# Patient Record
Sex: Male | Born: 1960 | Race: Black or African American | Hispanic: No | Marital: Married | Smoking: Former smoker
Health system: Southern US, Community
[De-identification: ages and names within clinical notes are randomized; demographics above are authoritative.]

## PROBLEM LIST (undated history)

## (undated) DIAGNOSIS — T7840XA Allergy, unspecified, initial encounter: Secondary | ICD-10-CM

## (undated) DIAGNOSIS — J302 Other seasonal allergic rhinitis: Secondary | ICD-10-CM

## (undated) DIAGNOSIS — M47812 Spondylosis without myelopathy or radiculopathy, cervical region: Secondary | ICD-10-CM

## (undated) DIAGNOSIS — R030 Elevated blood-pressure reading, without diagnosis of hypertension: Secondary | ICD-10-CM

## (undated) DIAGNOSIS — E7800 Pure hypercholesterolemia, unspecified: Secondary | ICD-10-CM

## (undated) DIAGNOSIS — I890 Lymphedema, not elsewhere classified: Secondary | ICD-10-CM

## (undated) DIAGNOSIS — Z8249 Family history of ischemic heart disease and other diseases of the circulatory system: Secondary | ICD-10-CM

## (undated) HISTORY — DX: Allergy, unspecified, initial encounter: T78.40XA

## (undated) HISTORY — DX: Other seasonal allergic rhinitis: J30.2

## (undated) HISTORY — DX: Family history of ischemic heart disease and other diseases of the circulatory system: Z82.49

## (undated) HISTORY — DX: Pure hypercholesterolemia, unspecified: E78.00

## (undated) HISTORY — DX: Elevated blood-pressure reading, without diagnosis of hypertension: R03.0

## (undated) HISTORY — DX: Lymphedema, not elsewhere classified: I89.0

## (undated) HISTORY — DX: Spondylosis without myelopathy or radiculopathy, cervical region: M47.812

---

## 2011-09-21 ENCOUNTER — Emergency Department (INDEPENDENT_AMBULATORY_CARE_PROVIDER_SITE_OTHER)
Admission: EM | Admit: 2011-09-21 | Discharge: 2011-09-21 | Disposition: A | Discharge status: Home / Self Care | Payer: Self-pay | Source: Home / Self Care

## 2011-09-21 ENCOUNTER — Encounter (HOSPITAL_COMMUNITY): Payer: Self-pay

## 2011-09-21 DIAGNOSIS — S39012A Strain of muscle, fascia and tendon of lower back, initial encounter: Secondary | ICD-10-CM

## 2011-09-21 DIAGNOSIS — S335XXA Sprain of ligaments of lumbar spine, initial encounter: Secondary | ICD-10-CM

## 2011-09-21 MED ORDER — METHOCARBAMOL 750 MG PO TABS: 750.0000 mg | ORAL_TABLET | Freq: Four times a day (QID) | ORAL | Status: AC

## 2011-09-21 MED ORDER — DICLOFENAC SODIUM 75 MG PO TBEC: 75.0000 mg | DELAYED_RELEASE_TABLET | Freq: Two times a day (BID) | ORAL | Status: AC

## 2011-09-21 NOTE — ED Provider Notes (Signed)
Medical screening examination/treatment/procedure(s) were performed by non-physician practitioner and as supervising physician I was immediately available for consultation/collaboration.  Raynald Blend, MD 09/21/11 1330
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
## 2011-09-21 NOTE — Discharge Instructions (Signed)
Back Exercises Back exercises help treat and prevent back injuries. The goal is to increase your strength in your belly (abdominal) and back muscles. These exercises can also help with flexibility. Start these exercises when told by your doctor. HOME CARE Back exercises include: Pelvic Tilt.  Lie on your back with your knees bent. Tilt your pelvis until the lower part of your back is against the floor. Hold this position 5 to 10 sec. Repeat this exercise 5 to 10 times.  Knee to Chest.  Pull 1 knee up against your chest and hold for 20 to 30 seconds. Repeat this with the other knee. This may be done with the other leg straight or bent, whichever feels better. Then, pull both knees up against your chest.  Sit-Ups or Curl-Ups.  Bend your knees 90 degrees. Start with tilting your pelvis, and do a partial, slow sit-up. Only lift your upper half 30 to 45 degrees off the floor. Take at least 2 to 3 seonds for each sit-up. Do not do sit-ups with your knees out straight. If partial sit-ups are difficult, simply do the above but with only tightening your belly (abdominal) muscles and holding it as told.  Hip-Lift.  Lie on your back with your knees flexed 90 degrees. Push down with your feet and shoulders as you raise your hips 2 inches off the floor. Hold for 10 seconds, repeat 5 to 10 times.  Back Arches.  Lie on your stomach. Prop yourself up on bent elbows. Slowly press on your hands, causing an arch in your low back. Repeat 3 to 5 times.  Shoulder-Lifts.  Lie face down with arms beside your body. Keep hips and belly pressed to floor as you slowly lift your head and shoulders off the floor.  Do not overdo your exercises. Be careful in the beginning. Exercises may cause you some mild back discomfort. If the pain lasts for more than 15 minutes, stop the exercises until you see your doctor. Improvement with exercise for back problems is slow.  Document Released: 06/30/2010 Document Revised: 05/17/2011  Document Reviewed: 06/30/2010 ExitCare Patient Information 2012 [employer]. 

## 2013-01-04 ENCOUNTER — Ambulatory Visit: Payer: BC Managed Care – PPO

## 2013-01-04 ENCOUNTER — Ambulatory Visit (INDEPENDENT_AMBULATORY_CARE_PROVIDER_SITE_OTHER): Payer: BC Managed Care – PPO | Admitting: Emergency Medicine

## 2013-01-04 VITALS — BP 118/92 | HR 77 | Temp 97.4°F | Resp 16 | Ht 75.5 in | Wt 232.2 lb

## 2013-01-04 DIAGNOSIS — M25519 Pain in unspecified shoulder: Secondary | ICD-10-CM

## 2013-01-04 DIAGNOSIS — M25511 Pain in right shoulder: Secondary | ICD-10-CM

## 2013-01-04 MED ORDER — MELOXICAM 15 MG PO TABS: 15.0000 mg | ORAL_TABLET | Freq: Every day | ORAL | Status: DC

## 2013-01-04 MED ORDER — HYDROCODONE-ACETAMINOPHEN 5-325 MG PO TABS: ORAL_TABLET | ORAL | Status: DC
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
## 2013-01-04 NOTE — Patient Instructions (Signed)
Bursitis Bursitis is a swelling and soreness (inflammation) of a fluid-filled sac (bursa) that overlies and protects a joint. It can be caused by injury, overuse of the joint, arthritis or infection. The joints most likely to be affected are the elbows, shoulders, hips and knees. HOME CARE INSTRUCTIONS   Apply ice to the affected area for 15-20 minutes each hour while awake for 2 days. Put the ice in a plastic bag and place a towel between the bag of ice and your skin.  Rest the injured joint as much as possible, but continue to put the joint through a full range of motion, 4 times per day. (The shoulder joint especially becomes rapidly "frozen" if not used.) When the pain lessens, begin normal slow movements and usual activities.  Only take over-the-counter or prescription medicines for pain, discomfort or fever as directed by your caregiver.  Your caregiver may recommend draining the bursa and injecting medicine into the bursa. This may help the healing process.  Follow all instructions for follow-up with your caregiver. This includes any orthopedic referrals, physical therapy and rehabilitation. Any delay in obtaining necessary care could result in a delay or failure of the bursitis to heal and chronic pain. SEEK IMMEDIATE MEDICAL CARE IF:   Your pain increases even during treatment.  You develop an oral temperature above 102 F (38.9 C) and have heat and inflammation over the involved bursa. MAKE SURE YOU:   Understand these instructions.  Will watch your condition.  Will get help right away if you are not doing well or get worse. Document Released: 05/25/2000 Document Revised: 08/20/2011 Document Reviewed: 04/29/2009 ExitCare Patient Information 2014 [employer].  

## 2019-03-13 ENCOUNTER — Ambulatory Visit (INDEPENDENT_AMBULATORY_CARE_PROVIDER_SITE_OTHER): Payer: Self-pay | Admitting: Interventional Cardiology

## 2019-03-13 ENCOUNTER — Encounter: Payer: Self-pay | Admitting: Interventional Cardiology

## 2019-03-13 ENCOUNTER — Other Ambulatory Visit: Payer: Self-pay

## 2019-03-13 VITALS — BP 132/101 | HR 65 | Ht 75.5 in | Wt 236.0 lb

## 2019-03-13 DIAGNOSIS — I10 Essential (primary) hypertension: Secondary | ICD-10-CM

## 2019-03-13 DIAGNOSIS — E785 Hyperlipidemia, unspecified: Secondary | ICD-10-CM

## 2019-03-13 MED ORDER — LOSARTAN POTASSIUM-HCTZ 50-12.5 MG PO TABS: 1.0000 | ORAL_TABLET | Freq: Every day | ORAL | 3 refills | Status: DC

## 2019-03-13 MED ORDER — ATORVASTATIN CALCIUM 20 MG PO TABS: 20.0000 mg | ORAL_TABLET | Freq: Every day | ORAL | 3 refills | Status: DC

## 2019-03-13 NOTE — Progress Notes (Signed)
Cardiology Office Note:    Date:  03/13/2019   ID:  Billy Figueroa, DOB [birth year], MRN [id number]  PCP:  Seward Carol, MD  Cardiologist:  No primary care provider on file.   Referring MD: Seward Carol, MD   Chief Complaint  Patient presents with   Hypertension   Hyperlipidemia   Advice Only    Family history CAD (father age 56)    History of Present Illness:    Billy Figueroa is a 58 y.o. male with a hx of hyperlipidemia and recent significant elevation in blood pressure requiring initiation of diuretic therapy.  Dr. Seward Carol is the primary care physician.  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  He is concerned about his blood pressure.  Despite therapy being started greater than a week ago he is still running diastolic blood pressures near and slightly above 100 mmHg.  He is tolerated hydrochlorothiazide 25 mg/day without difficulty.  He has no specific cardiac complaints with the exception of perhaps some mild dyspnea when walking up hills.  He has purposefully lost weight.  He has been mindful of salt.  He does not drink regularly.  He is not on nonsteroidal anti-inflammatory therapy.  He is concerned that his father (who had hypertension and smokes cigarettes) had a acute myocardial infarction in his late 10s and died of an MI at age 42.  His father was a Company secretary.  He has no siblings.  His mother now in her 58s has hypertension.  Past Medical History:  Diagnosis Date   Allergy    Arthritis of neck    Chronic acquired lymphedema    bilateral lower extremities   Elevated blood pressure reading    Family history of heart disease    High cholesterol    Seasonal allergies     History reviewed. No pertinent surgical history.  Current Medications: Current Meds  Medication Sig   HYDROcodone-acetaminophen (NORCO) 5-325 MG per tablet Take one tablet at night as needed for pain   levocetirizine (XYZAL) 5 MG  tablet Take 5 mg by mouth every evening.   [DISCONTINUED] atorvastatin (LIPITOR) 10 MG tablet Take 10 mg by mouth daily.   [DISCONTINUED] hydrochlorothiazide (HYDRODIURIL) 25 MG tablet Take 25 mg by mouth daily.     Allergies:   Other   Social History   Socioeconomic History   Marital status: Married    Spouse name: Not on file   Number of children: Not on file   Years of education: Not on file   Highest education level: Not on file  Occupational History   Occupation: university admin    Employer: [employer] resource strain: Not on file   Food insecurity    Worry: Not on file    Inability: Not on file   Transportation needs    Medical: Not on file    Non-medical: Not on file  Tobacco Use   Smoking status: Former Smoker   Smokeless tobacco: Never Used  Substance and Sexual Activity   Alcohol use: Yes    Comment: beer for wine occasionally   Drug use: No   Sexual activity: Not on file    Comment: married  Lifestyle   Physical activity    Days per week: Not on file    Minutes per session: Not on file   Stress: Not on file  Relationships   Social connections    Talks on phone:  Not on file    Gets together: Not on file    Attends religious service: Not on file    Active member of club or organization: Not on file    Attends meetings of clubs or organizations: Not on file    Relationship status: Not on file  Other Topics Concern   Not on file  Social History Narrative   Not on file     Family History: The patient's family history includes Arthritis in his maternal grandmother; Asthma in his maternal grandfather and mother; Heart disease in his father; Neuropathy in his mother; Stroke in his paternal grandmother.  ROS:   Please see the history of present illness.    Stress at his job at [employer].  He is responsible for his mother who is becoming more frail and requires attention.  All other  systems reviewed and are negative.  EKGs/Labs/Other Studies Reviewed:    The following studies were reviewed today: No cardiac evaluation has been performed. Labs performed 03/03/2019:  LDL cholesterol 104; total cholesterol 168  Creatinine 1.22.  BUN 16.    EKG:  EKG EKG reveals normal sinus rhythm, RSR prime V1.  Basically, overall EKG is normal in appearance.  When compared to a tracing performed [facility] in [facility], no changes noted.  Recent Labs: No results found for requested labs within last 8760 hours.  Recent Lipid Panel No results found for: CHOL, TRIG, HDL, CHOLHDL, VLDL, LDLCALC, LDLDIRECT  Physical Exam:    VS:  BP (!) 132/101    Pulse 65    Ht 6' 3.5" (1.918 m)    Wt 236 lb (107 kg)    SpO2 96%    BMI 29.11 kg/m     Wt Readings from Last 3 Encounters:  03/13/19 236 lb (107 kg)  01/04/13 232 lb 3.2 oz (105.3 kg)     GEN: Tall, nonobese. No acute distress HEENT: Normal NECK: No JVD. LYMPHATICS: No lymphadenopathy CARDIAC:  RRR without murmur, but there is an S4 gallop,.  No edema. VASCULAR:  Normal Pulses. No bruits. RESPIRATORY:  Clear to auscultation without rales, wheezing or rhonchi  ABDOMEN: Soft, non-tender, non-distended, No pulsatile mass, MUSCULOSKELETAL: No deformity  SKIN: Warm and dry NEUROLOGIC:  Alert and oriented x 3 PSYCHIATRIC:  Normal affect   ASSESSMENT:    1. Hypertension, unspecified type   2. Hyperlipidemia, unspecified hyperlipidemia type    PLAN:    In order of problems listed above:  1. Hypertension, with target 130/80 mmHg.  Will continue therapy for hypertension by discontinuing HCTZ and starting losartan HCTZ 50/12.5 mg.  We will consider increasing to 100/12.5 mg if blood pressure is not better controlled with 2 agents.  We discussed the possibility of needing even a third agent added.  He will have a basic metabolic panel done in 7 to 14 days.  He will require blood pressure home and we will make further  adjustments as needed based upon response. 2. Hyperlipidemia with risk factors that include sex, age, hypertension, and family history.  Further uptitrate Lipitor to 25 mg/day.  Overall education and awareness concerning primary risk prevention was discussed in detail: LDL less than 70, hemoglobin A1c less than 7, blood pressure target less than 130/80 mmHg, >150 minutes of moderate aerobic activity per week, avoidance of smoking, weight control (via diet and exercise), and continued surveillance/management of/for obstructive sleep apnea.    Medication Adjustments/Labs and Tests Ordered: Current medicines are reviewed at length with the patient  today.  Concerns regarding medicines are outlined above.  Orders Placed This Encounter  Procedures   Lipid panel   Basic metabolic panel   Hepatic function panel   EKG 12-Lead   Meds ordered this encounter  Medications   DISCONTD: losartan-hydrochlorothiazide (HYZAAR) 50-12.5 MG tablet    Sig: Take 1 tablet by mouth daily.    Dispense:  90 tablet    Refill:  3    D/c plain HCTZ   DISCONTD: atorvastatin (LIPITOR) 20 MG tablet    Sig: Take 1 tablet (20 mg total) by mouth daily.    Dispense:  90 tablet    Refill:  3    Dose change   atorvastatin (LIPITOR) 20 MG tablet    Sig: Take 1 tablet (20 mg total) by mouth daily.    Dispense:  90 tablet    Refill:  3    Dose change   losartan-hydrochlorothiazide (HYZAAR) 50-12.5 MG tablet    Sig: Take 1 tablet by mouth daily.    Dispense:  90 tablet    Refill:  3    D/c plain HCTZ    Patient Instructions  Medication Instructions:  1) INCREASE Atorvastatin to 20mg  once daily 2) DISCONTINUE Hydrochlorothiazide 3) START Losartan/HCTZ 50/12.5mg  once daily  If you need a refill on your cardiac medications before your next appointment, please call your pharmacy.   Lab work: Your physician recommends that you return for lab work in: 7-10 days (BMET)  Your physician recommends that you  return for lab work in: 6 weeks.  You will need to be fasting for these labs (nothing to eat or drink after midnight except water and black coffee).   If you have labs (blood work) drawn today and your tests are completely normal, you will receive your results only by:  [place] (if you have MyChart) OR  A paper copy in the mail If you have any lab test that is abnormal or we need to change your treatment, we will call you to review the results.  Testing/Procedures: None  Follow-Up: At [facility], you and your health needs are our priority.  As part of our continuing mission to provide you with exceptional heart care, we have created designated Provider Care Teams.  These Care Teams include your primary Cardiologist (physician) and Advanced Practice Providers (APPs -  Physician Assistants and Nurse Practitioners) who all work together to provide you with the care you need, when you need it. You will need a follow up appointment in 6 months.  Please call our office 2 months in advance to schedule this appointment.  You may see Dr. Tamala Julian or one of the following Advanced Practice Providers on your designated Care Team:   Truitt Merle, NP Cecilie Kicks, NP  Kathyrn Drown, NP  Any Other Special Instructions Will Be Listed Below (If Applicable).  Monitor your blood pressure daily for a week and contact the office with those readings.  Make sure you take it at least 2 hours after your blood pressure medication.       Signed, Sinclair Grooms, MD  03/13/2019 12:37 PM    [place]

## 2019-03-13 NOTE — Patient Instructions (Signed)
Medication Instructions:  1) INCREASE Atorvastatin to 20mg  once daily 2) DISCONTINUE Hydrochlorothiazide 3) START Losartan/HCTZ 50/12.5mg  once daily  If you need a refill on your cardiac medications before your next appointment, please call your pharmacy.   Lab work: Your physician recommends that you return for lab work in: 7-10 days (BMET)  Your physician recommends that you return for lab work in: 6 weeks.  You will need to be fasting for these labs (nothing to eat or drink after midnight except water and black coffee).   If you have labs (blood work) drawn today and your tests are completely normal, you will receive your results only by: [facility] Kitchen MyChart Message (if you have MyChart) OR . A paper copy in the mail If you have any lab test that is abnormal or we need to change your treatment, we will call you to review the results.  Testing/Procedures: None  Follow-Up: At [facility], you and your health needs are our priority.  As part of our continuing mission to provide you with exceptional heart care, we have created designated Provider Care Teams.  These Care Teams include your primary Cardiologist (physician) and Advanced Practice Providers (APPs -  Physician Assistants and Nurse Practitioners) who all work together to provide you with the care you need, when you need it. You will need a follow up appointment in 6 months.  Please call our office 2 months in advance to schedule this appointment.  You may see Dr. Tamala Julian or one of the following Advanced Practice Providers on your designated Care Team:   Truitt Merle, NP Cecilie Kicks, NP . Kathyrn Drown, NP  Any Other Special Instructions Will Be Listed Below (If Applicable).  Monitor your blood pressure daily for a week and contact the office with those readings.  Make sure you take it at least 2 hours after your blood pressure medication.

## 2019-03-23 ENCOUNTER — Other Ambulatory Visit: Payer: BC Managed Care – PPO | Admitting: *Deleted

## 2019-03-23 ENCOUNTER — Other Ambulatory Visit: Payer: Self-pay

## 2019-03-23 DIAGNOSIS — I10 Essential (primary) hypertension: Secondary | ICD-10-CM

## 2019-03-23 LAB — BASIC METABOLIC PANEL
BUN/Creatinine Ratio: 14 (ref 9–20)
BUN: 17 mg/dL (ref 6–24)
CO2: 26 mmol/L (ref 20–29)
Calcium: 9.4 mg/dL (ref 8.7–10.2)
Chloride: 102 mmol/L (ref 96–106)
Creatinine, Ser: 1.22 mg/dL (ref 0.76–1.27)
GFR calc Af Amer: 76 mL/min/{1.73_m2} (ref >59)
GFR calc non Af Amer: 66 mL/min/{1.73_m2} (ref >59)
Glucose: 104 mg/dL — ABNORMAL HIGH (ref 65–99)
Potassium: 4 mmol/L (ref 3.5–5.2)
Sodium: 141 mmol/L (ref 134–144)

## 2019-04-27 ENCOUNTER — Other Ambulatory Visit: Payer: Self-pay

## 2019-04-27 ENCOUNTER — Other Ambulatory Visit: Payer: BC Managed Care – PPO | Admitting: *Deleted

## 2019-04-27 DIAGNOSIS — E785 Hyperlipidemia, unspecified: Secondary | ICD-10-CM

## 2019-04-27 LAB — HEPATIC FUNCTION PANEL
ALT: 21 IU/L (ref 0–44)
AST: 15 IU/L (ref 0–40)
Albumin: 4.3 g/dL (ref 3.8–4.9)
Alkaline Phosphatase: 74 IU/L (ref 39–117)
Bilirubin Total: 0.7 mg/dL (ref 0.0–1.2)
Bilirubin, Direct: 0.16 mg/dL (ref 0.00–0.40)
Total Protein: 6.9 g/dL (ref 6.0–8.5)

## 2019-04-27 LAB — LIPID PANEL
Chol/HDL Ratio: 4 ratio (ref 0.0–5.0)
Cholesterol, Total: 198 mg/dL (ref 100–199)
HDL: 50 mg/dL (ref >39)
LDL Chol Calc (NIH): 127 mg/dL — ABNORMAL HIGH (ref 0–99)
Triglycerides: 117 mg/dL (ref 0–149)
VLDL Cholesterol Cal: 21 mg/dL (ref 5–40)

## 2019-04-30 ENCOUNTER — Ambulatory Visit: Payer: BC Managed Care – PPO | Admitting: Nurse Practitioner

## 2019-09-09 ENCOUNTER — Ambulatory Visit: Payer: BC Managed Care – PPO | Admitting: Interventional Cardiology

## 2019-09-22 NOTE — Progress Notes (Deleted)
Cardiology Office Note:    Date:  09/22/2019   ID:  Billy Figueroa, DOB 1960/12/26, MRN [id number]  PCP:  Seward Carol, MD  Cardiologist:  Sinclair Grooms, MD   Referring MD: Seward Carol, MD   No chief complaint on file.   History of Present Illness:    Billy Figueroa is a 59 y.o. male with a hx of hypertension and hyperlipidemia.  ***  Past Medical History:  Diagnosis Date  . Allergy   . Arthritis of neck   . Chronic acquired lymphedema    bilateral lower extremities  . Elevated blood pressure reading   . Family history of heart disease   . High cholesterol   . Seasonal allergies     No past surgical history on file.  Current Medications: No outpatient medications have been marked as taking for the 09/23/19 encounter (Appointment) with Belva Crome, MD.     Allergies:   Other   Social History   Socioeconomic History  . Marital status: Married    Spouse name: Not on file  . Number of children: Not on file  . Years of education: Not on file  . Highest education level: Not on file  Occupational History  . Occupation: Nurse, adult: [employer]  Tobacco Use  . Smoking status: Former Research scientist (life sciences)  . Smokeless tobacco: Never Used  Substance and Sexual Activity  . Alcohol use: Yes    Comment: beer for wine occasionally  . Drug use: No  . Sexual activity: Not on file    Comment: married  Other Topics Concern  . Not on file  Social History Narrative  . Not on file   Social Determinants of Health   Financial Resource Strain:   . Difficulty of Paying Living Expenses:   Food Insecurity:   . Worried About Charity fundraiser in the Last Year:   . Arboriculturist in the Last Year:   Transportation Needs:   . Film/video editor (Medical):   Marland Kitchen Lack of Transportation (Non-Medical):   Physical Activity:   . Days of Exercise per Week:   . Minutes of Exercise per Session:   Stress:   . Feeling of Stress :   Social  Connections:   . Frequency of Communication with Friends and Family:   . Frequency of Social Gatherings with Friends and Family:   . Attends Religious Services:   . Active Member of Clubs or Organizations:   . Attends Archivist Meetings:   Marland Kitchen Marital Status:      Family History: The patient's family history includes Arthritis in his maternal grandmother; Asthma in his maternal grandfather and mother; Heart disease in his father; Neuropathy in his mother; Stroke in his paternal grandmother.  ROS:   Please see the history of present illness.    *** All other systems reviewed and are negative.  EKGs/Labs/Other Studies Reviewed:    The following studies were reviewed today: ***  EKG:  EKG ***  Recent Labs: 03/23/2019: BUN 17; Creatinine, Ser 1.22; Potassium 4.0; Sodium 141 04/27/2019: ALT 21  Recent Lipid Panel    Component Value Date/Time   CHOL 198 04/27/2019 0804   TRIG 117 04/27/2019 0804   HDL 50 04/27/2019 0804   CHOLHDL 4.0 04/27/2019 0804   LDLCALC 127 (H) 04/27/2019 0804    Physical Exam:    VS:  There were no vitals taken for this visit.    Wt  Readings from Last 3 Encounters:  03/13/19 236 lb (107 kg)  01/04/13 232 lb 3.2 oz (105.3 kg)     GEN: ***. No acute distress HEENT: Normal NECK: No JVD. LYMPHATICS: No lymphadenopathy CARDIAC: *** RRR without murmur, gallop, or edema. VASCULAR: *** Normal Pulses. No bruits. RESPIRATORY:  Clear to auscultation without rales, wheezing or rhonchi  ABDOMEN: Soft, non-tender, non-distended, No pulsatile mass, MUSCULOSKELETAL: No deformity  SKIN: Warm and dry NEUROLOGIC:  Alert and oriented x 3 PSYCHIATRIC:  Normal affect   ASSESSMENT:    1. Hypertension, unspecified type    PLAN:    In order of problems listed above:  1. ***   Medication Adjustments/Labs and Tests Ordered: Current medicines are reviewed at length with the patient today.  Concerns regarding medicines are outlined above.  No  orders of the defined types were placed in this encounter.  No orders of the defined types were placed in this encounter.   There are no Patient Instructions on file for this visit.   Signed, Sinclair Grooms, MD  09/22/2019 6:22 PM    [facility]

## 2019-09-23 ENCOUNTER — Ambulatory Visit: Payer: BC Managed Care – PPO | Admitting: Interventional Cardiology

## 2019-09-30 NOTE — Progress Notes (Signed)
Cardiology Office Note   Date:  10/01/2019   ID:  Billy Figueroa, DOB 10/17/1960, MRN [id number]  PCP:  Seward Carol, MD  Cardiologist: Dr. Tamala Julian, MD   Chief Complaint  Patient presents with  . Follow-up    History of Present Illness: Billy Figueroa is a 59 y.o. male who presents for 78-month follow-up, seen for Dr. Tamala Julian.  Billy Figueroa has a history of hyperlipidemia and hypertension as well as a family history of CAD.  He was initially referred to Dr. Tamala Julian from his PCP for significant elevation in his blood pressure requiring initiation of diuretic therapy.  Despite being on therapy for greater than than 1 week his diastolic blood pressures continued to be above 100 mmHg.  Was recently seen by Dr. Tamala Julian 03/13/2019 at which time HCTZ was discontinued with the initiation of losartan/HCTZ at 50/12.5 mg with plans for potentially increasing this to 100/12.5 if BP does not respond to therapies.  Today Billy Figueroa is doing well from a CV standpoint. BP is very well controlled at 120/88 without taking his medications this morning. He has no c/o chest pain, palpitations, LE edema, dizziness or syncope. He has been busy taking care of his 33yo mother. Discussed obtaining lab work today.    Past Medical History:  Diagnosis Date  . Allergy   . Arthritis of neck   . Chronic acquired lymphedema    bilateral lower extremities  . Elevated blood pressure reading   . Family history of heart disease   . High cholesterol   . Seasonal allergies     History reviewed. No pertinent surgical history.   Current Outpatient Medications  Medication Sig Dispense Refill  . atorvastatin (LIPITOR) 20 MG tablet Take 1 tablet (20 mg total) by mouth daily. 90 tablet 3  . levocetirizine (XYZAL) 5 MG tablet Take 5 mg by mouth every evening.    Marland Kitchen losartan-hydrochlorothiazide (HYZAAR) 50-12.5 MG tablet Take 1 tablet by mouth daily. 90 tablet 3   No current facility-administered medications for  this visit.    Allergies:   Other    Social History:  The patient  reports that he has quit smoking. He has never used smokeless tobacco. He reports current alcohol use. He reports that he does not use drugs.   Family History:  The patient's family history includes Arthritis in his maternal grandmother; Asthma in his maternal grandfather and mother; Heart disease in his father; Neuropathy in his mother; Stroke in his paternal grandmother.    ROS:  Please see the history of present illness. Otherwise, review of systems are positive for none.  All other systems are reviewed and negative.    PHYSICAL EXAM: VS:  BP 120/88 (BP Location: Right Arm, Patient Position: Sitting, Cuff Size: Normal)   Pulse 69   Ht 6' 3.5" (1.918 m)   Wt 249 lb 12.8 oz (113.3 kg)   SpO2 97%   BMI 30.81 kg/m  , BMI Body mass index is 30.81 kg/m.   General: Well developed, well nourished, NAD Neck: Negative for carotid bruits. No JVD Lungs:Clear to ausculation bilaterally. No wheezes, rales, or rhonchi. Breathing is unlabored. Cardiovascular: RRR with S1 S2. No murmurs Extremities: No edema. Radial pulses 2+ bilaterally Neuro: Alert and oriented. No focal deficits. No facial asymmetry. MAE spontaneously. Psych: Responds to questions appropriately with normal affect.     EKG:  EKG is not ordered today.  Recent Labs: 03/23/2019: BUN 17; Creatinine, Ser 1.22; Potassium 4.0; Sodium 141 04/27/2019: ALT  21    Lipid Panel    Component Value Date/Time   CHOL 198 04/27/2019 0804   TRIG 117 04/27/2019 0804   HDL 50 04/27/2019 0804   CHOLHDL 4.0 04/27/2019 0804   LDLCALC 127 (H) 04/27/2019 0804    Wt Readings from Last 3 Encounters:  10/01/19 249 lb 12.8 oz (113.3 kg)  03/13/19 236 lb (107 kg)  01/04/13 232 lb 3.2 oz (105.3 kg)    ASSESSMENT AND PLAN:  1.  Hypertension: -Stable, 120/88 -Continue losartan/HCTZ 50/12.5 -Check BMET today   2.  Hyperlipidemia: -Last LDL, 127 on 04/27/2019 -Continue  current dose of atorvastatin -Will obtain CMET and lipid panel    Current medicines are reviewed at length with the patient today.  The patient does not have concerns regarding medicines.  The following changes have been made:  no change  Labs/ tests ordered today include: CMET, lipid  No orders of the defined types were placed in this encounter.   Disposition:   FU with Dr. Tamala Julian  in 1 year  Signed, Kathyrn Drown, NP  10/01/2019 8:51 AM    [address], [place], [place]  [address] Phone: [phone]; Fax: [phone]

## 2019-10-01 ENCOUNTER — Ambulatory Visit: Payer: BC Managed Care – PPO | Admitting: Cardiology

## 2019-10-01 ENCOUNTER — Other Ambulatory Visit: Payer: Self-pay

## 2019-10-01 ENCOUNTER — Encounter: Payer: Self-pay | Admitting: Cardiology

## 2019-10-01 VITALS — BP 120/88 | HR 69 | Ht 75.5 in | Wt 249.8 lb

## 2019-10-01 DIAGNOSIS — I10 Essential (primary) hypertension: Secondary | ICD-10-CM | POA: Diagnosis not present

## 2019-10-01 DIAGNOSIS — E785 Hyperlipidemia, unspecified: Secondary | ICD-10-CM | POA: Diagnosis not present

## 2019-10-01 LAB — COMPREHENSIVE METABOLIC PANEL
ALT: 18 IU/L (ref 0–44)
AST: 16 IU/L (ref 0–40)
Albumin/Globulin Ratio: 1.4 (ref 1.2–2.2)
Albumin: 4 g/dL (ref 3.8–4.9)
Alkaline Phosphatase: 61 IU/L (ref 39–117)
BUN/Creatinine Ratio: 10 (ref 9–20)
BUN: 13 mg/dL (ref 6–24)
Bilirubin Total: 0.5 mg/dL (ref 0.0–1.2)
CO2: 27 mmol/L (ref 20–29)
Calcium: 9.5 mg/dL (ref 8.7–10.2)
Chloride: 102 mmol/L (ref 96–106)
Creatinine, Ser: 1.26 mg/dL (ref 0.76–1.27)
GFR calc Af Amer: 73 mL/min/{1.73_m2} (ref >59)
GFR calc non Af Amer: 63 mL/min/{1.73_m2} (ref >59)
Globulin, Total: 2.9 g/dL (ref 1.5–4.5)
Glucose: 107 mg/dL — ABNORMAL HIGH (ref 65–99)
Potassium: 4.1 mmol/L (ref 3.5–5.2)
Sodium: 140 mmol/L (ref 134–144)
Total Protein: 6.9 g/dL (ref 6.0–8.5)

## 2019-10-01 LAB — LIPID PANEL
Chol/HDL Ratio: 3.4 ratio (ref 0.0–5.0)
Cholesterol, Total: 152 mg/dL (ref 100–199)
HDL: 45 mg/dL (ref >39)
LDL Chol Calc (NIH): 87 mg/dL (ref 0–99)
Triglycerides: 109 mg/dL (ref 0–149)
VLDL Cholesterol Cal: 20 mg/dL (ref 5–40)

## 2019-10-01 NOTE — Patient Instructions (Signed)
Medication Instructions:   Your physician recommends that you continue on your current medications as directed. Please refer to the Current Medication list given to you today.  *If you need a refill on your cardiac medications before your next appointment, please call your pharmacy*  Lab Work:  You will have labs drawn today: CMET/Lipid panel  If you have labs (blood work) drawn today and your tests are completely normal, you will receive your results only by: Marland Kitchen MyChart Message (if you have MyChart) OR . A paper copy in the mail If you have any lab test that is abnormal or we need to change your treatment, we will call you to review the results.  Testing/Procedures:  None ordered today  Follow-Up: At [facility], you and your health needs are our priority.  As part of our continuing mission to provide you with exceptional heart care, we have created designated Provider Care Teams.  These Care Teams include your primary Cardiologist (physician) and Advanced Practice Providers (APPs -  Physician Assistants and Nurse Practitioners) who all work together to provide you with the care you need, when you need it.  We recommend signing up for the patient portal called "MyChart".  Sign up information is provided on this After Visit Summary.  MyChart is used to connect with patients for Virtual Visits (Telemedicine).  Patients are able to view lab/test results, encounter notes, upcoming appointments, etc.  Non-urgent messages can be sent to your provider as well.   To learn more about what you can do with MyChart, go to [email or web address].    Your next appointment:   12 month(s)  The format for your next appointment:   In Person  Provider:   Daneen Schick, MD

## 2019-10-01 NOTE — Addendum Note (Signed)
Addended by: Mady Haagensen on: 10/01/2019 08:53 AM   Modules accepted: Orders

## 2020-03-04 ENCOUNTER — Telehealth: Payer: Self-pay | Admitting: Interventional Cardiology

## 2020-03-04 ENCOUNTER — Other Ambulatory Visit: Payer: Self-pay | Admitting: *Deleted

## 2020-03-04 NOTE — Telephone Encounter (Signed)
New message:      Patient calling concerning his medication might need to be look at. Please call patient.

## 2020-03-04 NOTE — Telephone Encounter (Signed)
Pt states he has noticed his BP has been elevated the last few days.  Last night BP was 164/90.  This morning 176/90.  Denies any symptoms.  Denies increased salt intake.  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  Recently started on Sertraline.  Was on 25mg  x 1 week and moved up to 50mg  two days ago.  States he hasn't been exercising as much but has not slowed down as far as being up and moving around all day.  Recently purchased a Engineer, civil (consulting) and is working on increasing his exercise now.  Advised pt we don't typically change meds when BP is up a day or two.  Advised to monitor BP QD, at least 2 hours after medication, for 5-7 days and call or MyChart me those numbers.  Pt verbalized understanding and was in agreement with plan.

## 2020-03-07 ENCOUNTER — Other Ambulatory Visit: Payer: Self-pay | Admitting: Interventional Cardiology

## 2020-09-17 ENCOUNTER — Encounter (INDEPENDENT_AMBULATORY_CARE_PROVIDER_SITE_OTHER): Payer: Self-pay

## 2021-02-20 ENCOUNTER — Other Ambulatory Visit: Payer: Self-pay | Admitting: Interventional Cardiology

## 2021-03-28 ENCOUNTER — Other Ambulatory Visit: Payer: Self-pay | Admitting: Interventional Cardiology

## 2021-03-29 ENCOUNTER — Other Ambulatory Visit: Payer: Self-pay | Admitting: Interventional Cardiology

## 2021-04-06 ENCOUNTER — Other Ambulatory Visit: Payer: Self-pay | Admitting: Interventional Cardiology

## 2021-04-18 ENCOUNTER — Ambulatory Visit
Admission: RE | Admit: 2021-04-18 | Discharge: 2021-04-18 | Disposition: A | Discharge status: Home / Self Care | Payer: BC Managed Care – PPO | Source: Ambulatory Visit | Attending: Internal Medicine | Admitting: Internal Medicine

## 2021-04-18 ENCOUNTER — Other Ambulatory Visit: Payer: Self-pay

## 2021-04-18 ENCOUNTER — Other Ambulatory Visit: Payer: Self-pay | Admitting: Internal Medicine

## 2021-04-18 DIAGNOSIS — R822 Biliuria: Secondary | ICD-10-CM

## 2021-04-18 DIAGNOSIS — R7989 Other specified abnormal findings of blood chemistry: Secondary | ICD-10-CM

## 2021-04-18 DIAGNOSIS — R82998 Other abnormal findings in urine: Secondary | ICD-10-CM

## 2021-04-19 ENCOUNTER — Other Ambulatory Visit: Payer: Self-pay | Admitting: Interventional Cardiology

## 2021-04-19 ENCOUNTER — Other Ambulatory Visit: Payer: BC Managed Care – PPO

## 2021-04-20 ENCOUNTER — Other Ambulatory Visit: Payer: BC Managed Care – PPO

## 2021-04-20 ENCOUNTER — Other Ambulatory Visit: Payer: Self-pay | Admitting: Internal Medicine

## 2021-04-20 DIAGNOSIS — R17 Unspecified jaundice: Secondary | ICD-10-CM

## 2021-04-20 DIAGNOSIS — R7989 Other specified abnormal findings of blood chemistry: Secondary | ICD-10-CM

## 2021-04-21 ENCOUNTER — Other Ambulatory Visit: Payer: Self-pay | Admitting: Interventional Cardiology

## 2021-04-22 ENCOUNTER — Emergency Department (HOSPITAL_COMMUNITY): Payer: BC Managed Care – PPO

## 2021-04-22 ENCOUNTER — Encounter (HOSPITAL_COMMUNITY): Payer: Self-pay | Admitting: Emergency Medicine

## 2021-04-22 ENCOUNTER — Observation Stay (HOSPITAL_COMMUNITY): Payer: BC Managed Care – PPO

## 2021-04-22 ENCOUNTER — Other Ambulatory Visit: Payer: Self-pay

## 2021-04-22 ENCOUNTER — Inpatient Hospital Stay (HOSPITAL_COMMUNITY)
Admission: EM | Admit: 2021-04-22 | Discharge: 2021-04-24 | DRG: 436 | Disposition: A | Discharge status: Home / Self Care | Payer: BC Managed Care – PPO | Attending: Internal Medicine | Admitting: Internal Medicine

## 2021-04-22 DIAGNOSIS — Z8249 Family history of ischemic heart disease and other diseases of the circulatory system: Secondary | ICD-10-CM

## 2021-04-22 DIAGNOSIS — C221 Intrahepatic bile duct carcinoma: Principal | ICD-10-CM | POA: Diagnosis present

## 2021-04-22 DIAGNOSIS — K625 Hemorrhage of anus and rectum: Secondary | ICD-10-CM | POA: Diagnosis present

## 2021-04-22 DIAGNOSIS — Z825 Family history of asthma and other chronic lower respiratory diseases: Secondary | ICD-10-CM

## 2021-04-22 DIAGNOSIS — Z823 Family history of stroke: Secondary | ICD-10-CM

## 2021-04-22 DIAGNOSIS — I10 Essential (primary) hypertension: Secondary | ICD-10-CM | POA: Diagnosis present

## 2021-04-22 DIAGNOSIS — K7200 Acute and subacute hepatic failure without coma: Secondary | ICD-10-CM

## 2021-04-22 DIAGNOSIS — N179 Acute kidney failure, unspecified: Secondary | ICD-10-CM | POA: Diagnosis present

## 2021-04-22 DIAGNOSIS — Z79899 Other long term (current) drug therapy: Secondary | ICD-10-CM

## 2021-04-22 DIAGNOSIS — R7401 Elevation of levels of liver transaminase levels: Secondary | ICD-10-CM | POA: Diagnosis present

## 2021-04-22 DIAGNOSIS — Z8261 Family history of arthritis: Secondary | ICD-10-CM

## 2021-04-22 DIAGNOSIS — R17 Unspecified jaundice: Secondary | ICD-10-CM | POA: Diagnosis present

## 2021-04-22 DIAGNOSIS — Z20822 Contact with and (suspected) exposure to covid-19: Secondary | ICD-10-CM | POA: Diagnosis present

## 2021-04-22 DIAGNOSIS — F419 Anxiety disorder, unspecified: Secondary | ICD-10-CM

## 2021-04-22 DIAGNOSIS — E782 Mixed hyperlipidemia: Secondary | ICD-10-CM | POA: Diagnosis present

## 2021-04-22 DIAGNOSIS — K649 Unspecified hemorrhoids: Secondary | ICD-10-CM | POA: Diagnosis present

## 2021-04-22 DIAGNOSIS — K8021 Calculus of gallbladder without cholecystitis with obstruction: Secondary | ICD-10-CM | POA: Diagnosis present

## 2021-04-22 DIAGNOSIS — Z87891 Personal history of nicotine dependence: Secondary | ICD-10-CM

## 2021-04-22 DIAGNOSIS — N289 Disorder of kidney and ureter, unspecified: Secondary | ICD-10-CM | POA: Diagnosis present

## 2021-04-22 LAB — I-STAT VENOUS BLOOD GAS, ED
Acid-Base Excess: 5 mmol/L — ABNORMAL HIGH (ref 0.0–2.0)
Bicarbonate: 30.3 mmol/L — ABNORMAL HIGH (ref 20.0–28.0)
Calcium, Ion: 1.08 mmol/L — ABNORMAL LOW (ref 1.15–1.40)
HCT: 45 % (ref 39.0–52.0)
Hemoglobin: 15.3 g/dL (ref 13.0–17.0)
O2 Saturation: 49 %
Potassium: 4.6 mmol/L (ref 3.5–5.1)
Sodium: 136 mmol/L (ref 135–145)
TCO2: 32 mmol/L (ref 22–32)
pCO2, Ven: 46.5 mmHg (ref 44.0–60.0)
pH, Ven: 7.422 (ref 7.250–7.430)
pO2, Ven: 26 mmHg — CL (ref 32.0–45.0)

## 2021-04-22 LAB — URINALYSIS, ROUTINE W REFLEX MICROSCOPIC
Glucose, UA: NEGATIVE mg/dL
Ketones, ur: NEGATIVE mg/dL
Leukocytes,Ua: NEGATIVE
Nitrite: NEGATIVE
Protein, ur: 30 mg/dL — AB
Specific Gravity, Urine: 1.01 (ref 1.005–1.030)
pH: 5 (ref 5.0–8.0)

## 2021-04-22 LAB — CBC WITH DIFFERENTIAL/PLATELET
Abs Immature Granulocytes: 0.05 10*3/uL (ref 0.00–0.07)
Basophils Absolute: 0 10*3/uL (ref 0.0–0.1)
Basophils Relative: 0 %
Eosinophils Absolute: 0.3 10*3/uL (ref 0.0–0.5)
Eosinophils Relative: 3 %
HCT: 42.7 % (ref 39.0–52.0)
Hemoglobin: 14.6 g/dL (ref 13.0–17.0)
Immature Granulocytes: 1 %
Lymphocytes Relative: 10 %
Lymphs Abs: 1 10*3/uL (ref 0.7–4.0)
MCH: 31.1 pg (ref 26.0–34.0)
MCHC: 34.2 g/dL (ref 30.0–36.0)
MCV: 91 fL (ref 80.0–100.0)
Monocytes Absolute: 0.7 10*3/uL (ref 0.1–1.0)
Monocytes Relative: 7 %
Neutro Abs: 8.6 10*3/uL — ABNORMAL HIGH (ref 1.7–7.7)
Neutrophils Relative %: 79 %
Platelets: 199 10*3/uL (ref 150–400)
RBC: 4.69 MIL/uL (ref 4.22–5.81)
RDW: 13.8 % (ref 11.5–15.5)
WBC: 10.7 10*3/uL — ABNORMAL HIGH (ref 4.0–10.5)
nRBC: 0 % (ref 0.0–0.2)

## 2021-04-22 LAB — COMPREHENSIVE METABOLIC PANEL
ALT: 732 U/L — ABNORMAL HIGH (ref 0–44)
AST: 394 U/L — ABNORMAL HIGH (ref 15–41)
Albumin: 3.3 g/dL — ABNORMAL LOW (ref 3.5–5.0)
Alkaline Phosphatase: 491 U/L — ABNORMAL HIGH (ref 38–126)
Anion gap: 11 (ref 5–15)
BUN: 17 mg/dL (ref 6–20)
CO2: 22 mmol/L (ref 22–32)
Calcium: 9.6 mg/dL (ref 8.9–10.3)
Chloride: 101 mmol/L (ref 98–111)
Creatinine, Ser: 2.06 mg/dL — ABNORMAL HIGH (ref 0.61–1.24)
GFR, Estimated: 37 mL/min — ABNORMAL LOW (ref >60)
Glucose, Bld: 135 mg/dL — ABNORMAL HIGH (ref 70–99)
Potassium: 3.9 mmol/L (ref 3.5–5.1)
Sodium: 134 mmol/L — ABNORMAL LOW (ref 135–145)
Total Bilirubin: 17.5 mg/dL — ABNORMAL HIGH (ref 0.3–1.2)
Total Protein: 7.9 g/dL (ref 6.5–8.1)

## 2021-04-22 LAB — FERRITIN: Ferritin: 1444 ng/mL — ABNORMAL HIGH (ref 24–336)

## 2021-04-22 LAB — LIPASE, BLOOD: Lipase: 35 U/L (ref 11–51)

## 2021-04-22 LAB — BILIRUBIN, DIRECT: Bilirubin, Direct: 9.9 mg/dL — ABNORMAL HIGH (ref 0.0–0.2)

## 2021-04-22 LAB — RESP PANEL BY RT-PCR (FLU A&B, COVID) ARPGX2
Influenza A by PCR: NEGATIVE
Influenza B by PCR: NEGATIVE
SARS Coronavirus 2 by RT PCR: NEGATIVE

## 2021-04-22 LAB — HIV ANTIBODY (ROUTINE TESTING W REFLEX): HIV Screen 4th Generation wRfx: NONREACTIVE

## 2021-04-22 LAB — ACETAMINOPHEN LEVEL: Acetaminophen (Tylenol), Serum: <10 ug/mL — ABNORMAL LOW (ref 10–30)

## 2021-04-22 LAB — SALICYLATE LEVEL: Salicylate Lvl: 8.3 mg/dL (ref 7.0–30.0)

## 2021-04-22 LAB — PROTIME-INR
INR: 1.2 (ref 0.8–1.2)
Prothrombin Time: 14.7 seconds (ref 11.4–15.2)

## 2021-04-22 LAB — OSMOLALITY: Osmolality: 291 mOsm/kg (ref 275–295)

## 2021-04-22 LAB — AMMONIA: Ammonia: 61 umol/L — ABNORMAL HIGH (ref 9–35)

## 2021-04-22 LAB — TSH: TSH: 1.251 u[IU]/mL (ref 0.350–4.500)

## 2021-04-22 MED ORDER — LACTATED RINGERS IV SOLN: INTRAVENOUS | Status: AC

## 2021-04-22 MED ORDER — ONDANSETRON HCL 4 MG/2ML IJ SOLN: 4.0000 mg | Freq: Four times a day (QID) | INTRAMUSCULAR | Status: DC | PRN

## 2021-04-22 MED ORDER — SODIUM CHLORIDE 0.9 % IV BOLUS
1000.0000 mL | Freq: Once | INTRAVENOUS | Status: AC
  Administered 2021-04-22: 1000 mL via INTRAVENOUS

## 2021-04-22 MED ORDER — LOSARTAN POTASSIUM-HCTZ 50-12.5 MG PO TABS: 1.0000 | ORAL_TABLET | Freq: Every day | ORAL | Status: DC

## 2021-04-22 MED ORDER — ONDANSETRON HCL 4 MG/2ML IJ SOLN
4.0000 mg | Freq: Once | INTRAMUSCULAR | Status: DC
  Filled 2021-04-22: qty 2

## 2021-04-22 MED ORDER — ONDANSETRON HCL 4 MG PO TABS: 4.0000 mg | ORAL_TABLET | Freq: Two times a day (BID) | ORAL | Status: DC | PRN

## 2021-04-22 MED ORDER — LACTATED RINGERS IV SOLN: INTRAVENOUS | Status: DC

## 2021-04-22 MED ORDER — ONDANSETRON 4 MG PO TBDP: 4.0000 mg | ORAL_TABLET | Freq: Once | ORAL | Status: DC

## 2021-04-22 MED ORDER — ONDANSETRON HCL 4 MG PO TABS: 4.0000 mg | ORAL_TABLET | Freq: Four times a day (QID) | ORAL | Status: DC | PRN

## 2021-04-22 MED ORDER — ONDANSETRON HCL 4 MG/2ML IJ SOLN: 4.0000 mg | Freq: Two times a day (BID) | INTRAMUSCULAR | Status: DC | PRN

## 2021-04-22 NOTE — ED Provider Notes (Signed)
[facility] EMERGENCY DEPARTMENT Provider Note   CSN: [id number] Arrival date & time: 04/22/21  1400     History Chief Complaint  Patient presents with   Jaundice    Billy Figueroa is a 60 y.o. male here with jaundice and vomiting.  Patient has been jaundiced for about a week or so.  Several days ago, patient went to see PCP and had AST and ALT in the 500s and bilirubin of 11.  He had a unremarkable right upper quadrant ultrasound.  Patient was scheduled for outpatient CT abdomen pelvis.  He states that today he felt nauseated and had an episode of diaphoresis. Denies any pain right now. Denies any fever.  He was noted to be hypotensive in triage  The history is provided by the patient.      Past Medical History:  Diagnosis Date   Allergy    Arthritis of neck    Chronic acquired lymphedema    bilateral lower extremities   Elevated blood pressure reading    Family history of heart disease    High cholesterol    Seasonal allergies     There are no problems to display for this patient.   History reviewed. No pertinent surgical history.     Family History  Problem Relation Age of Onset   Asthma Mother    Neuropathy Mother        legs   Heart disease Father    Arthritis Maternal Grandmother    Asthma Maternal Grandfather    Stroke Paternal Grandmother     Social History   Tobacco Use   Smoking status: Former   Smokeless tobacco: Never  Scientific laboratory technician Use: Never used  Substance Use Topics   Alcohol use: Yes    Comment: beer for wine occasionally   Drug use: No    Home Medications Prior to Admission medications   Medication Sig Start Date End Date Taking? Authorizing Provider  desonide (DESOWEN) 0.05 % ointment Apply 1-2 application topically 2 (two) times daily as needed (rash). 03/14/21  Yes [provider]  levocetirizine (XYZAL) 5 MG tablet Take 5 mg by mouth daily as needed for allergies.   Yes [provider]   losartan-hydrochlorothiazide (HYZAAR) 50-12.5 MG tablet Take 1 tablet by mouth daily. Pt needs to schedule appt with provider for additional refills - 3rd attempt 04/06/21  Yes Belva Crome, MD  PARoxetine (PAXIL) 20 MG tablet Take 20 mg by mouth daily. 04/05/21  Yes [provider]  atorvastatin (LIPITOR) 20 MG tablet TAKE 1 TABLET BY MOUTH EVERY DAY 03/28/21   Belva Crome, MD    Allergies    Other  Review of Systems   Review of Systems  Gastrointestinal:  Positive for vomiting.  All other systems reviewed and are negative.  Physical Exam Updated Vital Signs BP 112/84   Pulse 65   Temp 97.8 F (36.6 C) (Oral)   Resp 16   SpO2 99%   Physical Exam Vitals and nursing note reviewed.  HENT:     Head: Normocephalic.     Nose: Nose normal.     Mouth/Throat:     Mouth: Mucous membranes are moist.  Eyes:     Extraocular Movements: Extraocular movements intact.     Pupils: Pupils are equal, round, and reactive to light.     Comments: Jaundice  Cardiovascular:     Rate and Rhythm: Normal rate and regular rhythm.     Pulses: Normal  pulses.  Pulmonary:     Effort: Pulmonary effort is normal.     Breath sounds: Normal breath sounds.  Abdominal:     General: Abdomen is flat.     Palpations: Abdomen is soft.     Comments: Mild epigastric tenderness  Musculoskeletal:        General: Normal range of motion.     Cervical back: Normal range of motion and neck supple.  Skin:    General: Skin is warm.     Capillary Refill: Capillary refill takes less than 2 seconds.  Neurological:     General: No focal deficit present.     Mental Status: He is alert and oriented to person, place, and time.  Psychiatric:        Mood and Affect: Mood normal.        Behavior: Behavior normal.    ED Results / Procedures / Treatments   Labs (all labs ordered are listed, but only abnormal results are displayed) Labs Reviewed  COMPREHENSIVE METABOLIC PANEL - Abnormal; Notable for the  following components:      Result Value   Sodium 134 (*)    Glucose, Bld 135 (*)    Creatinine, Ser 2.06 (*)    Albumin 3.3 (*)    AST 394 (*)    ALT 732 (*)    Alkaline Phosphatase 491 (*)    Total Bilirubin 17.5 (*)    GFR, Estimated 37 (*)    All other components within normal limits  CBC WITH DIFFERENTIAL/PLATELET - Abnormal; Notable for the following components:   WBC 10.7 (*)    Neutro Abs 8.6 (*)    All other components within normal limits  BILIRUBIN, DIRECT - Abnormal; Notable for the following components:   Bilirubin, Direct 9.9 (*)    All other components within normal limits  RESP PANEL BY RT-PCR (FLU A&B, COVID) ARPGX2  LIPASE, BLOOD  OSMOLALITY  URINALYSIS, ROUTINE W REFLEX MICROSCOPIC  AMMONIA  BLOOD GAS, VENOUS  ACETAMINOPHEN LEVEL  SALICYLATE LEVEL    EKG None  Radiology CT ABDOMEN PELVIS WO CONTRAST  Result Date: 04/22/2021 CLINICAL DATA:  Abdominal distension and vomiting. EXAM: CT ABDOMEN AND PELVIS WITHOUT CONTRAST TECHNIQUE: Multidetector CT imaging of the abdomen and pelvis was performed following the standard protocol without IV contrast. COMPARISON:  None. FINDINGS: Lower chest: There is minimal atelectasis in both lung bases. Hepatobiliary: Gallstones are present. There is no bile duct dilatation. The liver is within normal limits. Pancreas: Unremarkable. No pancreatic ductal dilatation or surrounding inflammatory changes. Spleen: Normal in size without focal abnormality. Adrenals/Urinary Tract: There is a rounded 12 mm hypodense area in the lower pole the left kidney measuring 33 Hounsfield units. There is a second mildly hyperdense area in the lower pole the left kidney measuring 6 mm. There is no evidence for urinary tract calculus or hydronephrosis in either kidney. Kidneys are normal in size. Adrenal glands are within normal limits. Bladder is decompressed, but otherwise within normal limits. Stomach/Bowel: Stomach is within normal limits. Appendix  appears normal. No evidence of bowel wall thickening, distention, or acute inflammatory changes. There is submucosal fat deposition in the transverse colon. Vascular/Lymphatic: Aortic atherosclerosis. No enlarged abdominal or pelvic lymph nodes. Reproductive: Prostate is unremarkable. Other: There are small fat containing inguinal and umbilical hernias. There is no ascites or free air. Musculoskeletal: No acute or significant osseous findings. IMPRESSION: 1. No acute localizing process in the abdomen or pelvis. 2. Cholelithiasis. 3. There are 2 mildly hyperdense areas  in the left kidney which are too small to characterize. Recommend further evaluation with ultrasound or MRI to exclude small solid lesion. 4. Submucosal fat deposition in the transverse colon can be seen with chronic inflammation. No acute inflammatory changes. Electronically Signed   By: Ronney Asters M.D.   On: 04/22/2021 17:44   CT HEAD WO CONTRAST (5MM)  Result Date: 04/22/2021 CLINICAL DATA:  Delirium EXAM: CT HEAD WITHOUT CONTRAST TECHNIQUE: Contiguous axial images were obtained from the base of the skull through the vertex without intravenous contrast. COMPARISON:  None. FINDINGS: Brain: No evidence of acute intracranial hemorrhage or extra-axial collection.No evidence of mass lesion/concern mass effect.The ventricles are normal in size.There is bifrontal mild loss of gray-white matter differentiation (series 4, image 17). Vascular: No hyperdense vessel or unexpected calcification. Skull: Normal. Negative for fracture or focal lesion. Sinuses/Orbits: No acute finding. Other: None. IMPRESSION: Possible focal loss of gray-white matter differentiation along the bifrontal lobes. Recommend MRI for further evaluation. No acute intracranial hemorrhage. These results were called by telephone at the time of interpretation on 04/22/2021 at 5:56 pm to ED provider, who verbally acknowledged these results. Electronically Signed   By: Maurine Simmering M.D.    On: 04/22/2021 17:56    Procedures Procedures   Angiocath insertion Performed by: Wandra Arthurs  Consent: Verbal consent obtained. Risks and benefits: risks, benefits and alternatives were discussed Time out: Immediately prior to procedure a "time out" was called to verify the correct patient, procedure, equipment, support staff and site/side marked as required.  Preparation: Patient was prepped and draped in the usual sterile fashion.  Vein Location: R forearm   Ultrasound Guided  Gauge: 20 long   Normal blood return and flush without difficulty Patient tolerance: Patient tolerated the procedure well with no immediate complications.   CRITICAL CARE Performed by: Wandra Arthurs   Total critical care time:30 minutes  Critical care time was exclusive of separately billable procedures and treating other patients.  Critical care was necessary to treat or prevent imminent or life-threatening deterioration.  Critical care was time spent personally by me on the following activities: development of treatment plan with patient and/or surrogate as well as nursing, discussions with consultants, evaluation of patient's response to treatment, examination of patient, obtaining history from patient or surrogate, ordering and performing treatments and interventions, ordering and review of laboratory studies, ordering and review of radiographic studies, pulse oximetry and re-evaluation of patient's condition.   Medications Ordered in ED Medications  ondansetron (ZOFRAN) injection 4 mg (0 mg Intravenous Hold 04/22/21 1739)  sodium chloride 0.9 % bolus 1,000 mL (1,000 mLs Intravenous New Bag/Given 04/22/21 1738)    ED Course  I have reviewed the triage vital signs and the nursing notes.  Pertinent labs & imaging results that were available during my care of the patient were reviewed by me and considered in my medical decision making (see chart for details).    MDM Rules/Calculators/A&P                            Billy Figueroa is a 60 y.o. male here presenting with epigastric pain and jaundice.  Patient has painless jaundice.  We will get CT abdomen pelvis to rule mass  6:45 PM CT showed no mass.  His bilirubin is increased to 17 now.  His direct bili is 9.9.  His AST and ALT and alk phos is also more elevated.  He also has acute renal  failure with creatinine of 2 now. His CT didn't show any obvious mass.  His CT head that showed some bilateral loss of gray matter differentiation.  I talked to Dr. Watt Climes from [facility].  He recommend MRCP tonight.  He states that he will see the patient tomorrow and that patient can eat right now.  Will likely need MRI brain as well.  At this point, hospitalist to admit for acute renal failure, liver failure  Final Clinical Impression(s) / ED Diagnoses Final diagnoses:  None    Rx / DC Orders ED Discharge Orders     None        Drenda Freeze, MD 04/22/21 1846

## 2021-04-22 NOTE — ED Provider Notes (Signed)
Emergency Medicine Provider Triage Evaluation Note  Billy Figueroa , a 60 y.o. male  was evaluated in triage.  Pt complains of acutely worsening jaundice. Patient had new onset darkening urine, light stools, skin color changes 2 weeks ago with heartburn type pain. US liver performed with no acute blockage, patient was scheduled for CT. Patient now with nausea, vomiting, fever, chills, fatigue, and acutely worsening jaundice. Told by GI to come to ED. No hx heavy alcohol abuse, gallstones.  Review of Systems  Positive: As above Negative: As above  Physical Exam  BP (!) 80/61 (BP Location: Right Arm)   Pulse 91   Temp 97.8 F (36.6 C) (Oral)   Resp 18   SpO2 100%  Gen:   Awake, no distress, diaphoretic Resp:  Normal effort  MSK:   Moves extremities without difficulty  Other:  Significant jaundice and scleral icterus, minimal TTP ruq  Medical Decision Making  Medically screening exam initiated at 2:21 PM.  Appropriate orders placed.  Billy Figueroa was informed that the remainder of the evaluation will be completed by another provider, this initial triage assessment does not replace that evaluation, and the importance of remaining in the ED until their evaluation is complete.  Worsening jaundice, abdominal pain   Anselmo Pickler, PA-C 04/22/21 1423    Godfrey Pick, MD 04/23/21 [phone]

## 2021-04-22 NOTE — ED Notes (Signed)
ED TO INPATIENT HANDOFF REPORT  ED Nurse Name and Phone #:   S Name/Age/Gender Billy Figueroa 60 y.o. male Room/Bed: 026C/026C  Code Status   Code Status: Full Code  Home/SNF/Other Home  Is this baseline? Yes   Triage Complete: Triage complete  Chief Complaint Transaminitis [R74.01]  Triage Note Pt reports liver enzymes have been elevated recently and he hasn't had a CT.  Reports nausea and an episode of vomiting yesterday.  Jaundice since Monday.  Denies pain.  Denies nausea/vomiting today.  Pt diaphoretic.   Allergies Allergies  Allergen Reactions   Other Anaphylaxis and Other (See Comments)    Apples raw throat swelling    Level of Care/Admitting Diagnosis ED Disposition     ED Disposition  Admit   Condition  --   Comment  Hospital Area: [place] [[id number]]  Level of Care: Telemetry Medical [104]  May place patient in observation at [facility] or [place] if equivalent level of care is available:: Yes  Covid Evaluation: Confirmed COVID Negative  Diagnosis: Transaminitis [[id number]]  Admitting Physician: Orma Flaming [[id number]]  Attending Physician: Orma Flaming [[id number]]          B Medical/Surgery History Past Medical History:  Diagnosis Date   Allergy    Arthritis of neck    Chronic acquired lymphedema    bilateral lower extremities   Elevated blood pressure reading    Family history of heart disease    High cholesterol    Seasonal allergies    History reviewed. No pertinent surgical history.   A IV Location/Drains/Wounds Patient Lines/Drains/Airways Status     Active Line/Drains/Airways     Name Placement date Placement time Site Days   Peripheral IV 04/22/21 20 G Right Antecubital 04/22/21  1736  Antecubital  less than 1            Intake/Output Last 24 hours No intake or output data in the 24 hours ending 04/22/21 2023  Labs/Imaging Results for orders placed or performed during the hospital encounter  of 04/22/21 (from the past 48 hour(s))  Lipase, blood     Status: None   Collection Time: 04/22/21  2:29 PM  Result Value Ref Range   Lipase 35 11 - 51 U/L    Comment: Performed at [facility], [place] [address] [place], [place] [address]  Comprehensive metabolic panel     Status: Abnormal   Collection Time: 04/22/21  2:29 PM  Result Value Ref Range   Sodium 134 (L) 135 - 145 mmol/L   Potassium 3.9 3.5 - 5.1 mmol/L   Chloride 101 98 - 111 mmol/L   CO2 22 22 - 32 mmol/L   Glucose, Bld 135 (H) 70 - 99 mg/dL    Comment: Glucose reference range applies only to samples taken after fasting for at least 8 hours.   BUN 17 6 - 20 mg/dL   Creatinine, Ser 2.06 (H) 0.61 - 1.24 mg/dL   Calcium 9.6 8.9 - 10.3 mg/dL   Total Protein 7.9 6.5 - 8.1 g/dL   Albumin 3.3 (L) 3.5 - 5.0 g/dL   AST 394 (H) 15 - 41 U/L   ALT 732 (H) 0 - 44 U/L   Alkaline Phosphatase 491 (H) 38 - 126 U/L   Total Bilirubin 17.5 (H) 0.3 - 1.2 mg/dL   GFR, Estimated 37 (L) >60 mL/min    Comment: (NOTE) Calculated using the CKD-EPI Creatinine Equation (2021)    Anion gap 11 5 - 15  Comment: Performed at [facility], [place] [address], [place], [place] [address]  CBC with Differential     Status: Abnormal   Collection Time: 04/22/21  2:40 PM  Result Value Ref Range   WBC 10.7 (H) 4.0 - 10.5 K/uL   RBC 4.69 4.22 - 5.81 MIL/uL   Hemoglobin 14.6 13.0 - 17.0 g/dL   HCT 42.7 39.0 - 52.0 %   MCV 91.0 80.0 - 100.0 fL   MCH 31.1 26.0 - 34.0 pg   MCHC 34.2 30.0 - 36.0 g/dL   RDW 13.8 11.5 - 15.5 %   Platelets 199 150 - 400 K/uL   nRBC 0.0 0.0 - 0.2 %   Neutrophils Relative % 79 %   Neutro Abs 8.6 (H) 1.7 - 7.7 K/uL   Lymphocytes Relative 10 %   Lymphs Abs 1.0 0.7 - 4.0 K/uL   Monocytes Relative 7 %   Monocytes Absolute 0.7 0.1 - 1.0 K/uL   Eosinophils Relative 3 %   Eosinophils Absolute 0.3 0.0 - 0.5 K/uL   Basophils Relative 0 %   Basophils Absolute 0.0 0.0 - 0.1 K/uL   Immature Granulocytes 1 %   Abs  Immature Granulocytes 0.05 0.00 - 0.07 K/uL    Comment: Performed at [facility] [address], [place], [place] [address]  Resp Panel by RT-PCR (Flu A&B, Covid) Nasopharyngeal Swab     Status: None   Collection Time: 04/22/21  4:20 PM   Specimen: Nasopharyngeal Swab; Nasopharyngeal(NP) swabs in vial transport medium  Result Value Ref Range   SARS Coronavirus 2 by RT PCR NEGATIVE NEGATIVE    Comment: (NOTE) SARS-CoV-2 target nucleic acids are NOT DETECTED.  The SARS-CoV-2 RNA is generally detectable in upper respiratory specimens during the acute phase of infection. The lowest concentration of SARS-CoV-2 viral copies this assay can detect is 138 copies/mL. A negative result does not preclude SARS-Cov-2 infection and should not be used as the sole basis for treatment or other patient management decisions. A negative result may occur with  improper specimen collection/handling, submission of specimen other than nasopharyngeal swab, presence of viral mutation(s) within the areas targeted by this assay, and inadequate number of viral copies(<138 copies/mL). A negative result must be combined with clinical observations, patient history, and epidemiological information. The expected result is Negative.  Fact Sheet for Patients:  EntrepreneurPulse.com.au  Fact Sheet for Healthcare Providers:  IncredibleEmployment.be  This test is no t yet approved or cleared by the [place] FDA and  has been authorized for detection and/or diagnosis of SARS-CoV-2 by FDA under an Emergency Use Authorization (EUA). This EUA will remain  in effect (meaning this test can be used) for the duration of the COVID-19 declaration under Section 564(b)(1) of the Act, 21 U.S.C.section 360bbb-3(b)(1), unless the authorization is terminated  or revoked sooner.       Influenza A by PCR NEGATIVE NEGATIVE   Influenza B by PCR NEGATIVE NEGATIVE    Comment: (NOTE) The  Xpert Xpress SARS-CoV-2/FLU/RSV plus assay is intended as an aid in the diagnosis of influenza from Nasopharyngeal swab specimens and should not be used as a sole basis for treatment. Nasal washings and aspirates are unacceptable for Xpert Xpress SARS-CoV-2/FLU/RSV testing.  Fact Sheet for Patients: EntrepreneurPulse.com.au  Fact Sheet for Healthcare Providers: IncredibleEmployment.be  This test is not yet approved or cleared by the [place] FDA and has been authorized for detection and/or diagnosis of SARS-CoV-2 by FDA under an Emergency Use Authorization (EUA).  This EUA will remain in effect (meaning this test can be used) for the duration of the COVID-19 declaration under Section 564(b)(1) of the Act, 21 U.S.C. section 360bbb-3(b)(1), unless the authorization is terminated or revoked.  Performed at [facility], [place] [address] [place], [place] [address]   Ammonia     Status: Abnormal   Collection Time: 04/22/21  5:32 PM  Result Value Ref Range   Ammonia 61 (H) 9 - 35 umol/L    Comment: Performed at [facility] [address] [place], [place] [address]  Acetaminophen level     Status: Abnormal   Collection Time: 04/22/21  5:32 PM  Result Value Ref Range   Acetaminophen (Tylenol), Serum <10 (L) 10 - 30 ug/mL    Comment: (NOTE) Therapeutic concentrations vary significantly. A range of 10-30 ug/mL  may be an effective concentration for many patients. However, some  are best treated at concentrations outside of this range. Acetaminophen concentrations >150 ug/mL at 4 hours after ingestion  and >50 ug/mL at 12 hours after ingestion are often associated with  toxic reactions.  Performed at [facility], [place] [address] [place], [place] [address]   Salicylate level     Status: None   Collection Time: 04/22/21  5:32 PM  Result Value Ref Range   Salicylate Lvl 8.3 7.0 - 30.0 mg/dL    Comment: Performed at [facility] [address], [place], [place] [address]  Osmolality     Status: None   Collection Time: 04/22/21  5:32 PM  Result Value Ref Range   Osmolality 291 275 - 295 mOsm/kg    Comment: Performed at [facility], [place] [address] [place], [place] [address]  Bilirubin, direct     Status: Abnormal   Collection Time: 04/22/21  5:32 PM  Result Value Ref Range   Bilirubin, Direct 9.9 (H) 0.0 - 0.2 mg/dL    Comment: Performed at [facility] [address], [place], [place] [address]  I-Stat venous blood gas, ED     Status: Abnormal   Collection Time: 04/22/21  5:43 PM  Result Value Ref Range   pH, Ven 7.422 7.250 - 7.430   pCO2, Ven 46.5 44.0 - 60.0 mmHg   pO2, Ven 26.0 (LL) 32.0 - 45.0 mmHg   Bicarbonate 30.3 (H) 20.0 - 28.0 mmol/L   TCO2 32 22 - 32 mmol/L   O2 Saturation 49.0 %   Acid-Base Excess 5.0 (H) 0.0 - 2.0 mmol/L   Sodium 136 135 - 145 mmol/L   Potassium 4.6 3.5 - 5.1 mmol/L   Calcium, Ion 1.08 (L) 1.15 - 1.40 mmol/L   HCT 45.0 39.0 - 52.0 %   Hemoglobin 15.3 13.0 - 17.0 g/dL   Sample type VENOUS    Comment NOTIFIED PHYSICIAN    CT ABDOMEN PELVIS WO CONTRAST  Result Date: 04/22/2021 CLINICAL DATA:  Abdominal distension and vomiting. EXAM: CT ABDOMEN AND PELVIS WITHOUT CONTRAST TECHNIQUE: Multidetector CT imaging of the abdomen and pelvis was performed following the standard protocol without IV contrast. COMPARISON:  None. FINDINGS: Lower chest: There is minimal atelectasis in both lung bases. Hepatobiliary: Gallstones are present. There is no bile duct dilatation. The liver is within normal limits. Pancreas: Unremarkable. No pancreatic ductal dilatation or surrounding inflammatory changes. Spleen: Normal in size without focal abnormality. Adrenals/Urinary Tract: There is a rounded 12 mm hypodense area in the lower pole the left kidney measuring 33 Hounsfield units.  There is a second mildly hyperdense area in the lower pole the left kidney measuring 6 mm. There is  no evidence for urinary tract calculus or hydronephrosis in either kidney. Kidneys are normal in size. Adrenal glands are within normal limits. Bladder is decompressed, but otherwise within normal limits. Stomach/Bowel: Stomach is within normal limits. Appendix appears normal. No evidence of bowel wall thickening, distention, or acute inflammatory changes. There is submucosal fat deposition in the transverse colon. Vascular/Lymphatic: Aortic atherosclerosis. No enlarged abdominal or pelvic lymph nodes. Reproductive: Prostate is unremarkable. Other: There are small fat containing inguinal and umbilical hernias. There is no ascites or free air. Musculoskeletal: No acute or significant osseous findings. IMPRESSION: 1. No acute localizing process in the abdomen or pelvis. 2. Cholelithiasis. 3. There are 2 mildly hyperdense areas in the left kidney which are too small to characterize. Recommend further evaluation with ultrasound or MRI to exclude small solid lesion. 4. Submucosal fat deposition in the transverse colon can be seen with chronic inflammation. No acute inflammatory changes. Electronically Signed   By: Ronney Asters M.D.   On: 04/22/2021 17:44   CT HEAD WO CONTRAST (5MM)  Result Date: 04/22/2021 CLINICAL DATA:  [facility] EXAM: CT HEAD WITHOUT CONTRAST TECHNIQUE: Contiguous axial images were obtained from the base of the skull through the vertex without intravenous contrast. COMPARISON:  None. FINDINGS: Brain: No evidence of acute intracranial hemorrhage or extra-axial collection.No evidence of mass lesion/concern mass effect.The ventricles are normal in size.There is bifrontal mild loss of gray-white matter differentiation (series 4, image 17). Vascular: No hyperdense vessel or unexpected calcification. Skull: Normal. Negative for fracture or focal lesion. Sinuses/Orbits: No acute finding. Other: None. IMPRESSION: Possible focal loss of gray-white matter differentiation along the bifrontal lobes.  Recommend MRI for further evaluation. No acute intracranial hemorrhage. These results were called by telephone at the time of interpretation on 04/22/2021 at 5:56 pm to ED provider, who verbally acknowledged these results. Electronically Signed   By: Maurine Simmering M.D.   On: 04/22/2021 17:56    Pending Labs Unresulted Labs (From admission, onward)     Start     Ordered   04/23/21 0500  CBC  Tomorrow morning,   R        04/22/21 2015   04/23/21 0500  Comprehensive metabolic panel  Tomorrow morning,   R        04/22/21 2015   04/22/21 2013  HIV Antibody (routine testing w rflx)  (HIV Antibody (Routine testing w reflex) panel)  Once,   R        04/22/21 2015   04/22/21 2006  TSH  Once,   R        04/22/21 2005   04/22/21 1920  Protime-INR  Once,   STAT        04/22/21 1919   04/22/21 1903  Alpha-1-Antitrypsin Phenotyp  Once,   STAT        04/22/21 1902   04/22/21 1902  Anti-smooth muscle antibody, IgG  Once,   STAT        04/22/21 1902   04/22/21 1902  Ferritin  Once,   STAT        04/22/21 1902   04/22/21 1902  Ceruloplasmin  Once,   STAT        04/22/21 1902   04/22/21 1857  ANA w/Reflex if Positive  Once,   STAT        04/22/21 1856   04/22/21 1445  Blood gas, venous (at  [facility] and [facility], not at [facility])  ONCE - STAT,   STAT        04/22/21 1445   04/22/21 1420  Urinalysis, Routine w reflex microscopic  Once,   STAT        04/22/21 1421            Vitals/Pain Today's Vitals   04/22/21 1730 04/22/21 1800 04/22/21 1830 04/22/21 1900  BP: 100/75 111/89 112/84 135/81  Pulse: 72 63 65 64  Resp: 15 16 16 16   Temp:      TempSrc:      SpO2: 100% 100% 99% 100%  PainSc: 0-No pain       Isolation Precautions No active isolations  Medications Medications  ondansetron (ZOFRAN) injection 4 mg (0 mg Intravenous Hold 04/22/21 1739)  ondansetron (ZOFRAN) tablet 4 mg (has no administration in time range)    Or  ondansetron (ZOFRAN) injection 4 mg (has no administration in time range)   lactated ringers infusion (has no administration in time range)  sodium chloride 0.9 % bolus 1,000 mL (1,000 mLs Intravenous New Bag/Given 04/22/21 1738)    Mobility walks Low fall risk   Focused Assessments    R Recommendations: See Admitting Provider Note  Report given to:   Additional Notes:

## 2021-04-22 NOTE — ED Notes (Signed)
Pt transported to MRI 

## 2021-04-22 NOTE — ED Triage Notes (Addendum)
Pt reports liver enzymes have been elevated recently and he hasn't had a CT.  Reports nausea and an episode of vomiting yesterday.  Jaundice since Monday.  Denies pain.  Denies nausea/vomiting today.  Pt diaphoretic.

## 2021-04-22 NOTE — H&P (Signed)
History and Physical    Billy Figueroa XBD:[id number] DOB: 05-17-1961 DOA: 04/22/2021  PCP: Seward Carol, MD Consultants:  ortho: Dr. Joni Fears at [facility] GI: Dr. Michail Sermon, cardiology: Dr. Tamala Julian  Patient coming from:  Home - lives with his wife, Billy Figueroa   Chief Complaint: worsening jaundice, nauseated, lightheaded and sweaty.   HPI: Billy Figueroa is a 60 y.o. male with medical history significant of HTN, HLD who presented to ED with 1 week history of worsening jaundice and change in color of stool/urine. He came in today as he became nauseated, sweaty, lightheaded with worsening jaundice. He also had one episode of vomiting and some dry heaves. He states over the past week he noticed his eyes were starting to get yellow and his skin. He went on Monday to see his PCP for the jaundice and was told his liver enzymes were elevated and was sent to get ultrasound. He saw his PCP on the 11/10 and had a hepatitis panel which was negative and a CT scan was ordered. He states the past few days the jaundice has noticeably gotten worse and he looks yellow all over. His stool is light with limited color and urine is dark like tea. He denies any stomach pain. Has had a few episodes of loose stool. No constipation. He had one episode of bright red blood per rectum when he wiped but has hemorrhoids and has not had another episode. No hx of chrons or UC or other colitis bouts. No family history of IBD.   He has been on a statin "forever" and dose was increased to 68m just prior to the pandemic. This was stopped on 11/9 by his PCP. He has been on an SSRI for years, but was recently changed to paxil about 6 months ago. He has taken no tylenol or NSAIDs.   He denies any fever/chills, no headaches or vision changes, chest pain or palpitations, shortness of breath or cough, increased leg swelling.   He did have his 4th booster for covid on 03/28/21 No herbal supplements or vitamins. Does not smoke nor drink on a  regular occasion.    ED Course: vitals: Febrile, blood pressure 80/61, heart rate 91, respiratory rate 18, oxygen 100% on room air Pertinent labs: WBC 10.7, creatinine: 2.06, AST: 394, ALT: 732, alk phos: 491, total bilirubin: 17.5, direct bili: 9.9,  CT abdo/pelvis: Gallstones present no bile duct dilatation, liver within normal limits.  No acute process in abdomen or pelvis.  2 mildly hyperdense areas in the left kidney kidney which are too small to characterize recommend further evaluation with ultrasound or MRI.  Submucosal fat deposit in the transverse colon can be seen with chronic inflammation.  No acute inflammatory changes. CT head shows possible focal loss of gray-white matter differentiation along the bifrontal lobes.  Recommend MRI for further eval no other acute finding. In ED patient given 1 L bolus and Zofran.  GI was consulted and [facility] was asked to admit.  Review of Systems: As per HPI; otherwise review of systems reviewed and negative.   Ambulatory Status:  Ambulates without assistance   Past Medical History:  Diagnosis Date   Allergy    Arthritis of neck    Chronic acquired lymphedema    bilateral lower extremities   Elevated blood pressure reading    Family history of heart disease    High cholesterol    Seasonal allergies     History reviewed. No pertinent surgical history.  Social History   Socioeconomic History  Marital status: Married    Spouse name: Not on file   Number of children: Not on file   Years of education: Not on file   Highest education level: Not on file  Occupational History   Occupation: university admin    Employer: [employer]  Tobacco Use   Smoking status: Former   Smokeless tobacco: Never  Scientific laboratory technician Use: Never used  Substance and Sexual Activity   Alcohol use: Yes    Comment: beer for wine occasionally   Drug use: No   Sexual activity: Not on file    Comment: married  Other Topics Concern   Not on file   Social History Narrative   Not on file   Social Determinants of Health   Financial Resource Strain: Not on file  Food Insecurity: Not on file  Transportation Needs: Not on file  Physical Activity: Not on file  Stress: Not on file  Social Connections: Not on file  Intimate Partner Violence: Not on file    Allergies  Allergen Reactions   Other Anaphylaxis and Other (See Comments)    Apples raw throat swelling    Family History  Problem Relation Age of Onset   Asthma Mother    Neuropathy Mother        legs   Heart disease Father    Arthritis Maternal Grandmother    Asthma Maternal Grandfather    Stroke Paternal Grandmother     Prior to Admission medications   Medication Sig Start Date End Date Taking? Authorizing Provider  desonide (DESOWEN) 0.05 % ointment Apply 1-2 application topically 2 (two) times daily as needed (rash). 03/14/21  Yes [provider]  levocetirizine (XYZAL) 5 MG tablet Take 5 mg by mouth daily as needed for allergies.   Yes [provider]  losartan-hydrochlorothiazide (HYZAAR) 50-12.5 MG tablet Take 1 tablet by mouth daily. Pt needs to schedule appt with provider for additional refills - 3rd attempt 04/06/21  Yes Belva Crome, MD  PARoxetine (PAXIL) 20 MG tablet Take 20 mg by mouth daily. 04/05/21  Yes [provider]  atorvastatin (LIPITOR) 20 MG tablet TAKE 1 TABLET BY MOUTH EVERY DAY 03/28/21   Belva Crome, MD    Physical Exam: Vitals:   04/22/21 1730 04/22/21 1800 04/22/21 1830 04/22/21 1900  BP: 100/75 111/89 112/84 135/81  Pulse: 72 63 65 64  Resp: 15 16 16 16   Temp:      TempSrc:      SpO2: 100% 100% 99% 100%     General:  Appears calm and comfortable and is in NAD Eyes:  PERRL, EOMI, normal lids, iris. Jaundiced sclera.  ENT:  grossly normal hearing, lips & tongue, mmm; appropriate dentition Neck:  no LAD, masses or thyromegaly; no carotid bruits Cardiovascular:  RRR, no m/r/g. No LE edema.   Respiratory:   CTA bilaterally with no wheezes/rales/rhonchi.  Normal respiratory effort. Abdomen:  soft, NT, ND, NABS. No appreciable hepatomegaly. Negative murphy's sign Back:   normal alignment, no CVAT Skin:  no rash or induration seen on limited exam. Globally jaundiced.  Musculoskeletal:  grossly normal tone BUE/BLE, good ROM, no bony abnormality Lower extremity:  No LE edema.  Limited foot exam with no ulcerations.  2+ distal pulses. Psychiatric:  grossly normal mood and affect, speech fluent and appropriate, AOx3 Neurologic:  CN 2-12 grossly intact, moves all extremities in coordinated fashion, sensation intact. DTR 2+, gait deferred     Radiological Exams  on Admission: Independently reviewed - see discussion in A/P where applicable  CT ABDOMEN PELVIS WO CONTRAST  Result Date: 04/22/2021 CLINICAL DATA:  Abdominal distension and vomiting. EXAM: CT ABDOMEN AND PELVIS WITHOUT CONTRAST TECHNIQUE: Multidetector CT imaging of the abdomen and pelvis was performed following the standard protocol without IV contrast. COMPARISON:  None. FINDINGS: Lower chest: There is minimal atelectasis in both lung bases. Hepatobiliary: Gallstones are present. There is no bile duct dilatation. The liver is within normal limits. Pancreas: Unremarkable. No pancreatic ductal dilatation or surrounding inflammatory changes. Spleen: Normal in size without focal abnormality. Adrenals/Urinary Tract: There is a rounded 12 mm hypodense area in the lower pole the left kidney measuring 33 Hounsfield units. There is a second mildly hyperdense area in the lower pole the left kidney measuring 6 mm. There is no evidence for urinary tract calculus or hydronephrosis in either kidney. Kidneys are normal in size. Adrenal glands are within normal limits. Bladder is decompressed, but otherwise within normal limits. Stomach/Bowel: Stomach is within normal limits. Appendix appears normal. No evidence of bowel wall thickening, distention,  or acute inflammatory changes. There is submucosal fat deposition in the transverse colon. Vascular/Lymphatic: Aortic atherosclerosis. No enlarged abdominal or pelvic lymph nodes. Reproductive: Prostate is unremarkable. Other: There are small fat containing inguinal and umbilical hernias. There is no ascites or free air. Musculoskeletal: No acute or significant osseous findings. IMPRESSION: 1. No acute localizing process in the abdomen or pelvis. 2. Cholelithiasis. 3. There are 2 mildly hyperdense areas in the left kidney which are too small to characterize. Recommend further evaluation with ultrasound or MRI to exclude small solid lesion. 4. Submucosal fat deposition in the transverse colon can be seen with chronic inflammation. No acute inflammatory changes. Electronically Signed   By: Ronney Asters M.D.   On: 04/22/2021 17:44   CT HEAD WO CONTRAST (5MM)  Result Date: 04/22/2021 CLINICAL DATA:  Delirium EXAM: CT HEAD WITHOUT CONTRAST TECHNIQUE: Contiguous axial images were obtained from the base of the skull through the vertex without intravenous contrast. COMPARISON:  None. FINDINGS: Brain: No evidence of acute intracranial hemorrhage or extra-axial collection.No evidence of mass lesion/concern mass effect.The ventricles are normal in size.There is bifrontal mild loss of gray-white matter differentiation (series 4, image 17). Vascular: No hyperdense vessel or unexpected calcification. Skull: Normal. Negative for fracture or focal lesion. Sinuses/Orbits: No acute finding. Other: None. IMPRESSION: Possible focal loss of gray-white matter differentiation along the bifrontal lobes. Recommend MRI for further evaluation. No acute intracranial hemorrhage. These results were called by telephone at the time of interpretation on 04/22/2021 at 5:56 pm to ED provider, who verbally acknowledged these results. Electronically Signed   By: Maurine Simmering M.D.   On: 04/22/2021 17:56    EKG: pending    Labs on Admission: I  have personally reviewed the available labs and imaging studies at the time of the admission.  Pertinent labs:  WBC 10.7,  creatinine: 2.06,  AST: 394,  ALT: 732,  alk phos: 491,  total bilirubin: 17.5,  direct bili: 9.9,  Assessment/Plan Active Problems:   Transaminitis/hyperbilirubinemia  60 year old male presenting with 1 week onset of worsening jaundice and worsening transaminitis, hyperbilirubinemia nausea and vomiting.  -place in observation -GI has been consulted and will see tomorrow.  -MRCP tonight. RUQ [place] on 04/18/21 with no acute findings. CT abdomen: no acute process. Gallstones present. No bile duct dilatation. Liver wnl.  -labs including ANA, anti smooth muscle, ferritin, alph-1 antitrypsin, ceruloplasmin and TSH -hepatitis panel  negative this week at PCP -statin stopped 11/9 at PCP, has been on x 20 years -hold paxil  -no hx of tylenol use or regular alcohol use -labs on 11/10 AST 445>394, ALT: 705>732, Alk pho: 390>491, t.bili: 7.0>17.5 -no hx of IBD, fatigue or pruritis.   AKI (acute kidney injury) ([place]) -UA pending -pre renal vs. possible Hepatorenal.  -f/u on UA. Holding nephrotoxic drugs (losartan-hctz) -intake and output -gentle, time limited IVF -repeat bmp in AM  Abnormal head CT -concern for possible stroke off CT read; however, he has had no neurological deficits. He states when he came in he had low blood pressure and sounds like he had a more vagal type episode.  -MRI brain without contrast ordered to rule out stroke with abnormal CT finding -frequent neuro checks q 4 hours -telemetry monitoring     Essential hypertension -soft bp. Holding losartan-hctz in setting of AKI    Mixed hyperlipidemia Stop statin with transaminitis/hyperbilirubinemia to rule out drug toxicity.     Anxiety  Stop paxil to make sure not contributing as drug toxicity.   Hyperdense lesions in left kidney  -2 mildly hyperdense lesions in left kidney which are too small  too characterize on CT. Recommended further eval with US/MRI to exclude small solid lesion -f/u outpatient   There is no height or weight on file to calculate BMI.    Level of care: Telemetry Medical DVT prophylaxis:  SCDs until INR resulted.  Code Status:  Full - confirmed with patient Family Communication: None present Disposition Plan:  The patient is from: home  Anticipated d/c is to: home   Placed in observation for MRI, labs, IVF with hospital stay less than 2 midnights.    Patient is currently: stable.  Consults called: GI: Dr. Watt Climes   Admission status:  observation  Dragon dictation used in completing this note.    Orma Flaming MD [facility]   How to contact the [facility] Attending or Consulting provider [place] or covering provider during after hours [place], for this patient?  Check the care team in [facility] and look for a) attending/consulting [facility] provider listed and b) the [facility] team listed Log into [email or web address] and use  [place] universal password to access. If you do not have the password, please contact the hospital operator. Locate the [facility] provider you are looking for under [facility] and page to a number that you can be directly reached. If you still have difficulty reaching the provider, please page the [facility] (Director on Call) for the Hospitalists listed on amion for assistance.   04/22/2021, 7:42 PM

## 2021-04-23 ENCOUNTER — Encounter (HOSPITAL_COMMUNITY): Payer: Self-pay | Admitting: Family Medicine

## 2021-04-23 DIAGNOSIS — Z87891 Personal history of nicotine dependence: Secondary | ICD-10-CM | POA: Diagnosis not present

## 2021-04-23 DIAGNOSIS — N179 Acute kidney failure, unspecified: Secondary | ICD-10-CM | POA: Diagnosis present

## 2021-04-23 DIAGNOSIS — K625 Hemorrhage of anus and rectum: Secondary | ICD-10-CM | POA: Diagnosis present

## 2021-04-23 DIAGNOSIS — C221 Intrahepatic bile duct carcinoma: Secondary | ICD-10-CM | POA: Diagnosis present

## 2021-04-23 DIAGNOSIS — R7401 Elevation of levels of liver transaminase levels: Secondary | ICD-10-CM | POA: Diagnosis present

## 2021-04-23 DIAGNOSIS — R17 Unspecified jaundice: Secondary | ICD-10-CM | POA: Diagnosis present

## 2021-04-23 DIAGNOSIS — Z825 Family history of asthma and other chronic lower respiratory diseases: Secondary | ICD-10-CM | POA: Diagnosis not present

## 2021-04-23 DIAGNOSIS — K7200 Acute and subacute hepatic failure without coma: Secondary | ICD-10-CM | POA: Diagnosis not present

## 2021-04-23 DIAGNOSIS — I10 Essential (primary) hypertension: Secondary | ICD-10-CM

## 2021-04-23 DIAGNOSIS — Z20822 Contact with and (suspected) exposure to covid-19: Secondary | ICD-10-CM | POA: Diagnosis present

## 2021-04-23 DIAGNOSIS — K649 Unspecified hemorrhoids: Secondary | ICD-10-CM | POA: Diagnosis present

## 2021-04-23 DIAGNOSIS — Z8249 Family history of ischemic heart disease and other diseases of the circulatory system: Secondary | ICD-10-CM | POA: Diagnosis not present

## 2021-04-23 DIAGNOSIS — E782 Mixed hyperlipidemia: Secondary | ICD-10-CM | POA: Diagnosis present

## 2021-04-23 DIAGNOSIS — Z79899 Other long term (current) drug therapy: Secondary | ICD-10-CM | POA: Diagnosis not present

## 2021-04-23 DIAGNOSIS — Z823 Family history of stroke: Secondary | ICD-10-CM | POA: Diagnosis not present

## 2021-04-23 DIAGNOSIS — Z8261 Family history of arthritis: Secondary | ICD-10-CM | POA: Diagnosis not present

## 2021-04-23 DIAGNOSIS — N289 Disorder of kidney and ureter, unspecified: Secondary | ICD-10-CM | POA: Diagnosis present

## 2021-04-23 DIAGNOSIS — F419 Anxiety disorder, unspecified: Secondary | ICD-10-CM | POA: Diagnosis present

## 2021-04-23 DIAGNOSIS — K8021 Calculus of gallbladder without cholecystitis with obstruction: Secondary | ICD-10-CM | POA: Diagnosis present

## 2021-04-23 LAB — CBC
HCT: 39.7 % (ref 39.0–52.0)
Hemoglobin: 13.5 g/dL (ref 13.0–17.0)
MCH: 30.8 pg (ref 26.0–34.0)
MCHC: 34 g/dL (ref 30.0–36.0)
MCV: 90.6 fL (ref 80.0–100.0)
Platelets: 170 10*3/uL (ref 150–400)
RBC: 4.38 MIL/uL (ref 4.22–5.81)
RDW: 14.1 % (ref 11.5–15.5)
WBC: 9.8 10*3/uL (ref 4.0–10.5)
nRBC: 0 % (ref 0.0–0.2)

## 2021-04-23 LAB — COMPREHENSIVE METABOLIC PANEL
ALT: 561 U/L — ABNORMAL HIGH (ref 0–44)
AST: 262 U/L — ABNORMAL HIGH (ref 15–41)
Albumin: 2.8 g/dL — ABNORMAL LOW (ref 3.5–5.0)
Alkaline Phosphatase: 436 U/L — ABNORMAL HIGH (ref 38–126)
Anion gap: 11 (ref 5–15)
BUN: 23 mg/dL — ABNORMAL HIGH (ref 6–20)
CO2: 23 mmol/L (ref 22–32)
Calcium: 8.6 mg/dL — ABNORMAL LOW (ref 8.9–10.3)
Chloride: 98 mmol/L (ref 98–111)
Creatinine, Ser: 2.01 mg/dL — ABNORMAL HIGH (ref 0.61–1.24)
GFR, Estimated: 38 mL/min — ABNORMAL LOW (ref >60)
Glucose, Bld: 133 mg/dL — ABNORMAL HIGH (ref 70–99)
Potassium: 4.3 mmol/L (ref 3.5–5.1)
Sodium: 132 mmol/L — ABNORMAL LOW (ref 135–145)
Total Bilirubin: 16.1 mg/dL — ABNORMAL HIGH (ref 0.3–1.2)
Total Protein: 6.7 g/dL (ref 6.5–8.1)

## 2021-04-23 MED ORDER — LACTATED RINGERS IV SOLN: INTRAVENOUS | Status: DC

## 2021-04-23 NOTE — Progress Notes (Signed)
Please                        PROGRESS NOTE        PATIENT DETAILS Name: Billy Figueroa Age: 60 y.o. Sex: male Date of Birth: [birth year] Admit Date: 04/22/2021 Admitting Physician Orma Flaming, MD GPQ:DIYMEB, Jori Moll, MD  Brief Narrative: Patient is a 60 y.o. male with history of HTN, HLD-who was recently diagnosed with jaundice to the ED with nausea/vomiting.  See below for further details.  Subjective: Lying comfortably in bed-denies any chest pain or shortness of breath.  No further nausea or vomiting.  Objective: Vitals: Blood pressure 131/75, pulse 60, temperature 98.6 F (37 C), temperature source Oral, resp. rate 18, height 6\' 3"  (1.905 m), weight 107.7 kg, SpO2 96 %.   Exam: Gen Exam:Alert awake-not in any distress.  Significant scleral icterus. HEENT:atraumatic, normocephalic Chest: B/L clear to auscultation anteriorly CVS:S1S2 regular Abdomen:soft non tender, non distended Extremities:no edema Neurology: Non focal Skin: no rash  Pertinent Labs/Radiology: Recent Labs  Lab 04/22/21 1429 04/22/21 1440 04/23/21 0148  WBC  --    < > 9.8  HGB  --    < > 13.5  PLT  --    < > 170  NA 134*   < > 132*  K 3.9   < > 4.3  CREATININE 2.06*  --  2.01*  AST 394*  --  262*  ALT 732*  --  561*  ALKPHOS 491*  --  436*  BILITOT 17.5*  --  16.1*   < > = values in this interval not displayed.    11/12>> MRI abdomen: Central intrahepatic ductal dilatation with abnormal soft tissue mass at hilar confluence.   Assessment/Plan: Jaundice- due to ?cholangiocarcinoma: Await GI input regarding ERCP/decompression with stent.  AKI: Likely hemodynamically mediated-in the setting of nausea/vomiting/HCTZ losartan use-UA with very minimal proteinuria-renal ultrasound without hydronephrosis.  Continue to gently hydrate and follow renal function.  HTN: BP stable-continue to hold HCTZ/losartan given AKI-if blood pressure starts increasing-we will use amlodipine.  HLD: Hold statins  given significant amount of transaminitis.  BMI Estimated body mass index is 29.68 kg/m as calculated from the following:   Height as of this encounter: 6\' 3"  (1.905 m).   Weight as of this encounter: 107.7 kg.   Procedures: None Consults: GI DVT Prophylaxis: SCD Code Status:Full code  Family Communication: None at bedside  Time spent: 35 minutes-Greater than 50% of this time was spent in counseling, explanation of diagnosis, planning of further management, and coordination of care.   Disposition Plan: Status is: Observation  The patient will require care spanning > 2 midnights and should be moved to inpatient because: Obstructive jaundice/AKI-on IVF-await GI input-May require ERCP/decompression.  Diet: Diet Order             Diet Heart Room service appropriate? Yes; Fluid consistency: Thin  Diet effective now                     Antimicrobial agents: Anti-infectives (From admission, onward)    None        MEDICATIONS: Scheduled Meds:  ondansetron (ZOFRAN) IV  4 mg Intravenous Once   Continuous Infusions: PRN Meds:.ondansetron **OR** ondansetron (ZOFRAN) IV   I have personally reviewed following labs and imaging studies  LABORATORY DATA: CBC: Recent Labs  Lab 04/22/21 1440 04/22/21 1743 04/23/21 0148  WBC 10.7*  --  9.8  NEUTROABS 8.6*  --   --  HGB 14.6 15.3 13.5  HCT 42.7 45.0 39.7  MCV 91.0  --  90.6  PLT 199  --  956    Basic Metabolic Panel: Recent Labs  Lab 04/22/21 1429 04/22/21 1743 04/23/21 0148  NA 134* 136 132*  K 3.9 4.6 4.3  CL 101  --  98  CO2 22  --  23  GLUCOSE 135*  --  133*  BUN 17  --  23*  CREATININE 2.06*  --  2.01*  CALCIUM 9.6  --  8.6*    GFR: Estimated Creatinine Clearance: 53.1 mL/min (A) (by C-G formula based on SCr of 2.01 mg/dL (H)).  Liver Function Tests: Recent Labs  Lab 04/22/21 1429 04/23/21 0148  AST 394* 262*  ALT 732* 561*  ALKPHOS 491* 436*  BILITOT 17.5* 16.1*  PROT 7.9 6.7   ALBUMIN 3.3* 2.8*   Recent Labs  Lab 04/22/21 1429  LIPASE 35   Recent Labs  Lab 04/22/21 1732  AMMONIA 61*    Coagulation Profile: Recent Labs  Lab 04/22/21 1920  INR 1.2    Cardiac Enzymes: No results for input(s): CKTOTAL, CKMB, CKMBINDEX, TROPONINI in the last 168 hours.  BNP (last 3 results) No results for input(s): PROBNP in the last 8760 hours.  Lipid Profile: No results for input(s): CHOL, HDL, LDLCALC, TRIG, CHOLHDL, LDLDIRECT in the last 72 hours.  Thyroid Function Tests: Recent Labs    04/22/21 2030  TSH 1.251    Anemia Panel: Recent Labs    04/22/21 1902  FERRITIN 1,444*    Urine analysis:    Component Value Date/Time   COLORURINE AMBER (A) 04/22/2021 2203   APPEARANCEUR HAZY (A) 04/22/2021 2203   LABSPEC 1.010 04/22/2021 2203   PHURINE 5.0 04/22/2021 2203   GLUCOSEU NEGATIVE 04/22/2021 2203   HGBUR SMALL (A) 04/22/2021 2203   BILIRUBINUR MODERATE (A) 04/22/2021 2203   KETONESUR NEGATIVE 04/22/2021 2203   PROTEINUR 30 (A) 04/22/2021 2203   NITRITE NEGATIVE 04/22/2021 2203   LEUKOCYTESUR NEGATIVE 04/22/2021 2203    Sepsis Labs: Lactic Acid, Venous No results found for: LATICACIDVEN  MICROBIOLOGY: Recent Results (from the past 240 hour(s))  Resp Panel by RT-PCR (Flu A&B, Covid) Nasopharyngeal Swab     Status: None   Collection Time: 04/22/21  4:20 PM   Specimen: Nasopharyngeal Swab; Nasopharyngeal(NP) swabs in vial transport medium  Result Value Ref Range Status   SARS Coronavirus 2 by RT PCR NEGATIVE NEGATIVE Final    Comment: (NOTE) SARS-CoV-2 target nucleic acids are NOT DETECTED.  The SARS-CoV-2 RNA is generally detectable in upper respiratory specimens during the acute phase of infection. The lowest concentration of SARS-CoV-2 viral copies this assay can detect is 138 copies/mL. A negative result does not preclude SARS-Cov-2 infection and should not be used as the sole basis for treatment or other patient management  decisions. A negative result may occur with  improper specimen collection/handling, submission of specimen other than nasopharyngeal swab, presence of viral mutation(s) within the areas targeted by this assay, and inadequate number of viral copies(<138 copies/mL). A negative result must be combined with clinical observations, patient history, and epidemiological information. The expected result is Negative.  Fact Sheet for Patients:  EntrepreneurPulse.com.au  Fact Sheet for Healthcare Providers:  IncredibleEmployment.be  This test is no t yet approved or cleared by the [place] FDA and  has been authorized for detection and/or diagnosis of SARS-CoV-2 by FDA under an Emergency Use Authorization (EUA). This EUA will remain  in effect (meaning this  test can be used) for the duration of the COVID-19 declaration under Section 564(b)(1) of the Act, 21 U.S.C.section 360bbb-3(b)(1), unless the authorization is terminated  or revoked sooner.       Influenza A by PCR NEGATIVE NEGATIVE Final   Influenza B by PCR NEGATIVE NEGATIVE Final    Comment: (NOTE) The Xpert Xpress SARS-CoV-2/FLU/RSV plus assay is intended as an aid in the diagnosis of influenza from Nasopharyngeal swab specimens and should not be used as a sole basis for treatment. Nasal washings and aspirates are unacceptable for Xpert Xpress SARS-CoV-2/FLU/RSV testing.  Fact Sheet for Patients: EntrepreneurPulse.com.au  Fact Sheet for Healthcare Providers: IncredibleEmployment.be  This test is not yet approved or cleared by the [place] FDA and has been authorized for detection and/or diagnosis of SARS-CoV-2 by FDA under an Emergency Use Authorization (EUA). This EUA will remain in effect (meaning this test can be used) for the duration of the COVID-19 declaration under Section 564(b)(1) of the Act, 21 U.S.C. section 360bbb-3(b)(1), unless the  authorization is terminated or revoked.  Performed at [facility], [place] [address], [place], [place] [address]     RADIOLOGY STUDIES/RESULTS: CT ABDOMEN PELVIS WO CONTRAST  Result Date: 04/22/2021 CLINICAL DATA:  Abdominal distension and vomiting. EXAM: CT ABDOMEN AND PELVIS WITHOUT CONTRAST TECHNIQUE: Multidetector CT imaging of the abdomen and pelvis was performed following the standard protocol without IV contrast. COMPARISON:  None. FINDINGS: Lower chest: There is minimal atelectasis in both lung bases. Hepatobiliary: Gallstones are present. There is no bile duct dilatation. The liver is within normal limits. Pancreas: Unremarkable. No pancreatic ductal dilatation or surrounding inflammatory changes. Spleen: Normal in size without focal abnormality. Adrenals/Urinary Tract: There is a rounded 12 mm hypodense area in the lower pole the left kidney measuring 33 Hounsfield units. There is a second mildly hyperdense area in the lower pole the left kidney measuring 6 mm. There is no evidence for urinary tract calculus or hydronephrosis in either kidney. Kidneys are normal in size. Adrenal glands are within normal limits. Bladder is decompressed, but otherwise within normal limits. Stomach/Bowel: Stomach is within normal limits. Appendix appears normal. No evidence of bowel wall thickening, distention, or acute inflammatory changes. There is submucosal fat deposition in the transverse colon. Vascular/Lymphatic: Aortic atherosclerosis. No enlarged abdominal or pelvic lymph nodes. Reproductive: Prostate is unremarkable. Other: There are small fat containing inguinal and umbilical hernias. There is no ascites or free air. Musculoskeletal: No acute or significant osseous findings. IMPRESSION: 1. No acute localizing process in the abdomen or pelvis. 2. Cholelithiasis. 3. There are 2 mildly hyperdense areas in the left kidney which are too small to characterize. Recommend further evaluation with ultrasound  or MRI to exclude small solid lesion. 4. Submucosal fat deposition in the transverse colon can be seen with chronic inflammation. No acute inflammatory changes. Electronically Signed   By: Ronney Asters M.D.   On: 04/22/2021 17:44   CT HEAD WO CONTRAST (5MM)  Result Date: 04/22/2021 CLINICAL DATA:  Delirium EXAM: CT HEAD WITHOUT CONTRAST TECHNIQUE: Contiguous axial images were obtained from the base of the skull through the vertex without intravenous contrast. COMPARISON:  None. FINDINGS: Brain: No evidence of acute intracranial hemorrhage or extra-axial collection.No evidence of mass lesion/concern mass effect.The ventricles are normal in size.There is bifrontal mild loss of gray-white matter differentiation (series 4, image 17). Vascular: No hyperdense vessel or unexpected calcification. Skull: Normal. Negative for fracture or focal lesion. Sinuses/Orbits: No acute finding. Other: None. IMPRESSION: Possible focal loss  of gray-white matter differentiation along the bifrontal lobes. Recommend MRI for further evaluation. No acute intracranial hemorrhage. These results were called by telephone at the time of interpretation on 04/22/2021 at 5:56 pm to ED provider, who verbally acknowledged these results. Electronically Signed   By: Maurine Simmering M.D.   On: 04/22/2021 17:56   MR BRAIN WO CONTRAST  Result Date: 04/23/2021 CLINICAL DATA:  Initial evaluation for neuro deficit, stroke suspected. Question abnormality on prior head CT. EXAM: MRI HEAD WITHOUT CONTRAST TECHNIQUE: Multiplanar, multiecho pulse sequences of the brain and surrounding structures were obtained without intravenous contrast. COMPARISON:  Prior head CT from earlier the same day. FINDINGS: Brain: Cerebral volume within normal limits. No significant cerebral white matter disease for age. No abnormal foci of restricted diffusion to suggest acute or subacute ischemia. Gray-white matter differentiation maintained. No encephalomalacia to suggest  chronic cortical infarction. No evidence for acute or chronic intracranial hemorrhage. No mass lesion, mass effect or midline shift. No hydrocephalus or extra-axial fluid collection. Pituitary gland suprasellar region normal. Midline structures intact. Vascular: Major intracranial vascular flow voids are maintained at the skull base. Skull and upper cervical spine: Craniocervical junction within normal limits. Bone marrow signal intensity within normal limits. No scalp soft tissue abnormality. Sinuses/Orbits: Globes and orbital soft tissues demonstrate no acute finding. Mild scattered mucosal thickening noted within the ethmoidal air cells. Paranasal sinuses are otherwise largely clear. No significant mastoid effusion. Other: None. IMPRESSION: Normal brain MRI for age. No acute intracranial abnormality identified. Previously questioned abnormality involving the bifrontal region was artifactual on prior CT. Electronically Signed   By: Jeannine Boga M.D.   On: 04/23/2021 00:32   MR ABDOMEN MRCP WO CONTRAST  Result Date: 04/22/2021 CLINICAL DATA:  Liver failure, renal lesions on CT EXAM: MRI ABDOMEN WITHOUT CONTRAST  (INCLUDING MRCP) TECHNIQUE: Multiplanar multisequence MR imaging of the abdomen was performed. Heavily T2-weighted images of the biliary and pancreatic ducts were obtained, and three-dimensional MRCP images were rendered by post processing. COMPARISON:  CT abdomen/pelvis dated 04/22/2021 FINDINGS: Lower chest: Lung bases are clear. Hepatobiliary: Liver is within normal limits. No hepatic steatosis. No focal hepatic lesion. Gallbladder is unremarkable. No gallstones, gallbladder wall thickening, or pericholecystic fluid. Central intrahepatic ductal prominence with possible abnormal soft tissue at the hilar confluence measuring 1.9 x 1.8 cm (series 9/image 16), poorly evaluated on unenhanced MR. 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 Common  duct is not dilated, measuring 3 mm (series 8/image 17). No choledocholithiasis is seen. Pancreas:  Within normal limits. Spleen:  Within normal limits. Adrenals/Urinary Tract:  Adrenal glands are within normal limits. 16 mm simple exophytic left lower pole renal cyst (series 9/image 35), benign (Bosniak I). 8 mm posterior left lower pole renal cyst with layering hemorrhage (series 9/image 36), benign (Bosniak II). These correspond to the 2 lesions on recent CT. Right kidney is within normal limits.  No hydronephrosis. Stomach/Bowel: Stomach is within normal limits. Visualized bowel is grossly unremarkable. Vascular/Lymphatic:  No evidence of abdominal aortic aneurysm. No suspicious abdominal lymphadenopathy. Other:  No abdominal ascites. Musculoskeletal: No focal osseous lesions. IMPRESSION: Central intrahepatic ductal prominence with possible abnormal soft tissue at the hilar confluence, measuring 1.8 x 1.9 cm. 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 GI consultation is suggested; if follow-up MR is desired, patient may be eligible for contrast with GFR greater than 30. Common duct is within normal  limits. No choledocholithiasis is seen. Left lower pole renal cysts noted on recent CT are benign (Bosniak I-II). Electronically Signed   By: Julian Hy M.D.   On: 04/22/2021 23:27     LOS: 0 days   Oren Binet, MD  [facility]    To contact the attending provider between 7A-7P or the covering provider during after hours 7P-7A, please log into the web site [email or web address] and access using universal [place] password for that web site. If you do not have the password, please call the hospital operator.  04/23/2021, 9:56 AM

## 2021-04-23 NOTE — Plan of Care (Signed)
  Problem: Education: Goal: Knowledge of General Education information will improve Description: Including pain rating scale, medication(s)/side effects and non-pharmacologic comfort measures Outcome: Progressing   Problem: Health Behavior/Discharge Planning: Goal: Ability to manage health-related needs will improve Outcome: Progressing   Problem: Clinical Measurements: Goal: Ability to maintain clinical measurements within normal limits will improve Outcome: Progressing Goal: Will remain free from infection Outcome: Progressing Goal: Respiratory complications will improve Outcome: Progressing   Problem: Clinical Measurements: Goal: Diagnostic test results will improve Outcome: Not Progressing

## 2021-04-23 NOTE — Progress Notes (Addendum)
Pt admitted to 5w; oriented to use of lights and bed settings and call bell.  Pt has a patent right PIV infusing LR at 75 ml/hour.  Pt instructed to use the urinal in the bathroom so we can measure his output.  Pt is A&O x4, no pain reported. Pt diet order verified to be cardiac; made patient a microwave dinner.  Skin check complete w/ 2nd RN, tele monitor applied and logged with tele unit. Portable monitor.   No questions from patient; call bell placed within reach.   Pt states he had his 5th covid booster and flu shot on 03/28/21.

## 2021-04-23 NOTE — Plan of Care (Signed)

## 2021-04-23 NOTE — Consult Note (Signed)
Reason for Consult: Abnormal liver tests Referring Physician: Hospital team  Billy Figueroa is an 60 y.o. male.  HPI: Patient seen and examined and case discussed with ER physician and hospital team as well as his primary gastroenterologist Dr. Michail Sermon in his hospital computer chart and our office computer chart was reviewed and he says his jaundice started about 2 weeks ago after his vaccine and flu vaccine and he denies any new medicine or much herbs or vitamins and has never been told he had a kidney or liver problem and his family history is negative for any liver disease and no iron overload copper issues etc. and he has had a few spells of weakness and sweats but otherwise he has a good appetite and his energy is okay and he has no specific complaints and hepatitis A, B, and C were negative in our office and his ultrasound and CT scan and MRI were all reviewed and he has no other complaints  Past Medical History:  Diagnosis Date   Allergy    Arthritis of neck    Chronic acquired lymphedema    bilateral lower extremities   Elevated blood pressure reading    Family history of heart disease    High cholesterol    Seasonal allergies     History reviewed. No pertinent surgical history.  Family History  Problem Relation Age of Onset   Asthma Mother    Neuropathy Mother        legs   Heart disease Father    Arthritis Maternal Grandmother    Asthma Maternal Grandfather    Stroke Paternal Grandmother     Social History:  reports that he has quit smoking. He has never used smokeless tobacco. He reports current alcohol use. He reports that he does not use drugs.  Allergies:  Allergies  Allergen Reactions   Other Anaphylaxis and Other (See Comments)    Apples raw throat swelling    Medications: I have reviewed the patient's current medications.  Results for orders placed or performed during the hospital encounter of 04/22/21 (from the past 48 hour(s))  Lipase, blood      Status: None   Collection Time: 04/22/21  2:29 PM  Result Value Ref Range   Lipase 35 11 - 51 U/L    Comment: Performed at [facility], [place] [address] [place], [place] [address]  Comprehensive metabolic panel     Status: Abnormal   Collection Time: 04/22/21  2:29 PM  Result Value Ref Range   Sodium 134 (L) 135 - 145 mmol/L   Potassium 3.9 3.5 - 5.1 mmol/L   Chloride 101 98 - 111 mmol/L   CO2 22 22 - 32 mmol/L   Glucose, Bld 135 (H) 70 - 99 mg/dL    Comment: Glucose reference range applies only to samples taken after fasting for at least 8 hours.   BUN 17 6 - 20 mg/dL   Creatinine, Ser 2.06 (H) 0.61 - 1.24 mg/dL   Calcium 9.6 8.9 - 10.3 mg/dL   Total Protein 7.9 6.5 - 8.1 g/dL   Albumin 3.3 (L) 3.5 - 5.0 g/dL   AST 394 (H) 15 - 41 U/L   ALT 732 (H) 0 - 44 U/L   Alkaline Phosphatase 491 (H) 38 - 126 U/L   Total Bilirubin 17.5 (H) 0.3 - 1.2 mg/dL   GFR, Estimated 37 (L) >60 mL/min    Comment: (NOTE) Calculated using the CKD-EPI Creatinine Equation (2021)    Anion gap  11 5 - 15    Comment: Performed at [facility], [place] [address] [place], [place] [address]  CBC with Differential     Status: Abnormal   Collection Time: 04/22/21  2:40 PM  Result Value Ref Range   WBC 10.7 (H) 4.0 - 10.5 K/uL   RBC 4.69 4.22 - 5.81 MIL/uL   Hemoglobin 14.6 13.0 - 17.0 g/dL   HCT 42.7 39.0 - 52.0 %   MCV 91.0 80.0 - 100.0 fL   MCH 31.1 26.0 - 34.0 pg   MCHC 34.2 30.0 - 36.0 g/dL   RDW 13.8 11.5 - 15.5 %   Platelets 199 150 - 400 K/uL   nRBC 0.0 0.0 - 0.2 %   Neutrophils Relative % 79 %   Neutro Abs 8.6 (H) 1.7 - 7.7 K/uL   Lymphocytes Relative 10 %   Lymphs Abs 1.0 0.7 - 4.0 K/uL   Monocytes Relative 7 %   Monocytes Absolute 0.7 0.1 - 1.0 K/uL   Eosinophils Relative 3 %   Eosinophils Absolute 0.3 0.0 - 0.5 K/uL   Basophils Relative 0 %   Basophils Absolute 0.0 0.0 - 0.1 K/uL   Immature Granulocytes 1 %   Abs Immature Granulocytes 0.05 0.00 - 0.07 K/uL    Comment:  Performed at [facility] [address], [place], [place] [address]  Resp Panel by RT-PCR (Flu A&B, Covid) Nasopharyngeal Swab     Status: None   Collection Time: 04/22/21  4:20 PM   Specimen: Nasopharyngeal Swab; Nasopharyngeal(NP) swabs in vial transport medium  Result Value Ref Range   SARS Coronavirus 2 by RT PCR NEGATIVE NEGATIVE    Comment: (NOTE) SARS-CoV-2 target nucleic acids are NOT DETECTED.  The SARS-CoV-2 RNA is generally detectable in upper respiratory specimens during the acute phase of infection. The lowest concentration of SARS-CoV-2 viral copies this assay can detect is 138 copies/mL. A negative result does not preclude SARS-Cov-2 infection and should not be used as the sole basis for treatment or other patient management decisions. A negative result may occur with  improper specimen collection/handling, submission of specimen other than nasopharyngeal swab, presence of viral mutation(s) within the areas targeted by this assay, and inadequate number of viral copies(<138 copies/mL). A negative result must be combined with clinical observations, patient history, and epidemiological information. The expected result is Negative.  Fact Sheet for Patients:  EntrepreneurPulse.com.au  Fact Sheet for Healthcare Providers:  IncredibleEmployment.be  This test is no t yet approved or cleared by the [place] FDA and  has been authorized for detection and/or diagnosis of SARS-CoV-2 by FDA under an Emergency Use Authorization (EUA). This EUA will remain  in effect (meaning this test can be used) for the duration of the COVID-19 declaration under Section 564(b)(1) of the Act, 21 U.S.C.section 360bbb-3(b)(1), unless the authorization is terminated  or revoked sooner.       Influenza A by PCR NEGATIVE NEGATIVE   Influenza B by PCR NEGATIVE NEGATIVE    Comment: (NOTE) The Xpert Xpress SARS-CoV-2/FLU/RSV plus assay is intended as  an aid in the diagnosis of influenza from Nasopharyngeal swab specimens and should not be used as a sole basis for treatment. Nasal washings and aspirates are unacceptable for Xpert Xpress SARS-CoV-2/FLU/RSV testing.  Fact Sheet for Patients: EntrepreneurPulse.com.au  Fact Sheet for Healthcare Providers: IncredibleEmployment.be  This test is not yet approved or cleared by the [place] FDA and has been authorized for detection and/or diagnosis of SARS-CoV-2 by  FDA under an Emergency Use Authorization (EUA). This EUA will remain in effect (meaning this test can be used) for the duration of the COVID-19 declaration under Section 564(b)(1) of the Act, 21 U.S.C. section 360bbb-3(b)(1), unless the authorization is terminated or revoked.  Performed at [facility], [place] [address] [place], [place] [address]   Ammonia     Status: Abnormal   Collection Time: 04/22/21  5:32 PM  Result Value Ref Range   Ammonia 61 (H) 9 - 35 umol/L    Comment: Performed at [facility] [address], [place], [place] [address]  Acetaminophen level     Status: Abnormal   Collection Time: 04/22/21  5:32 PM  Result Value Ref Range   Acetaminophen (Tylenol), Serum <10 (L) 10 - 30 ug/mL    Comment: (NOTE) Therapeutic concentrations vary significantly. A range of 10-30 ug/mL  may be an effective concentration for many patients. However, some  are best treated at concentrations outside of this range. Acetaminophen concentrations >150 ug/mL at 4 hours after ingestion  and >50 ug/mL at 12 hours after ingestion are often associated with  toxic reactions.  Performed at [facility], [place] [address] [place], [place] [address]   Salicylate level     Status: None   Collection Time: 04/22/21  5:32 PM  Result Value Ref Range   Salicylate Lvl 8.3 7.0 - 30.0 mg/dL    Comment: Performed at [facility] [address], [place], [place] [address]   Osmolality     Status: None   Collection Time: 04/22/21  5:32 PM  Result Value Ref Range   Osmolality 291 275 - 295 mOsm/kg    Comment: Performed at [facility], [place] [address] [place], [place] [address]  Bilirubin, direct     Status: Abnormal   Collection Time: 04/22/21  5:32 PM  Result Value Ref Range   Bilirubin, Direct 9.9 (H) 0.0 - 0.2 mg/dL    Comment: Performed at [facility] [address] [place], [place] [address]  I-Stat venous blood gas, ED     Status: Abnormal   Collection Time: 04/22/21  5:43 PM  Result Value Ref Range   pH, Ven 7.422 7.250 - 7.430   pCO2, Ven 46.5 44.0 - 60.0 mmHg   pO2, Ven 26.0 (LL) 32.0 - 45.0 mmHg   Bicarbonate 30.3 (H) 20.0 - 28.0 mmol/L   TCO2 32 22 - 32 mmol/L   O2 Saturation 49.0 %   Acid-Base Excess 5.0 (H) 0.0 - 2.0 mmol/L   Sodium 136 135 - 145 mmol/L   Potassium 4.6 3.5 - 5.1 mmol/L   Calcium, Ion 1.08 (L) 1.15 - 1.40 mmol/L   HCT 45.0 39.0 - 52.0 %   Hemoglobin 15.3 13.0 - 17.0 g/dL   Sample type VENOUS    Comment NOTIFIED PHYSICIAN   Ferritin     Status: Abnormal   Collection Time: 04/22/21  7:02 PM  Result Value Ref Range   Ferritin 1,444 (H) 24 - 336 ng/mL    Comment: Performed at [facility], [place] [address] [place], [place] [address]  HIV Antibody (routine testing w rflx)     Status: None   Collection Time: 04/22/21  7:02 PM  Result Value Ref Range   HIV Screen 4th Generation wRfx Non Reactive Non Reactive    Comment: Performed at [facility], [place] [address], [place],  [address]  Protime-INR  Status: None   Collection Time: 04/22/21  7:20 PM  Result Value Ref Range   Prothrombin Time 14.7 11.4 - 15.2 seconds   INR 1.2 0.8 - 1.2    Comment: (NOTE) INR goal varies based on device and disease states. Performed at [facility], [place] [address] [place], [place] [address]   TSH     Status: None   Collection Time: 04/22/21  8:30 PM  Result Value Ref Range   TSH 1.251 0.350 -  4.500 uIU/mL    Comment: Performed by a 3rd Generation assay with a functional sensitivity of <=0.01 uIU/mL. Performed at [facility], [place] [address] [place], [place] [address]   Urinalysis, Routine w reflex microscopic     Status: Abnormal   Collection Time: 04/22/21 10:03 PM  Result Value Ref Range   Color, Urine AMBER (A) YELLOW    Comment: BIOCHEMICALS MAY BE AFFECTED BY COLOR   APPearance HAZY (A) CLEAR   Specific Gravity, Urine 1.010 1.005 - 1.030   pH 5.0 5.0 - 8.0   Glucose, UA NEGATIVE NEGATIVE mg/dL   Hgb urine dipstick SMALL (A) NEGATIVE   Bilirubin Urine MODERATE (A) NEGATIVE   Ketones, ur NEGATIVE NEGATIVE mg/dL   Protein, ur 30 (A) NEGATIVE mg/dL   Nitrite NEGATIVE NEGATIVE   Leukocytes,Ua NEGATIVE NEGATIVE   RBC / HPF 0-5 0 - 5 RBC/hpf   WBC, UA 6-10 0 - 5 WBC/hpf   Bacteria, UA FEW (A) NONE SEEN   Squamous Epithelial / LPF 6-10 0 - 5   Mucus PRESENT    Hyaline Casts, UA PRESENT    Granular Casts, UA PRESENT     Comment: Performed at [facility], [address], [place] [address]  CBC     Status: None   Collection Time: 04/23/21  1:48 AM  Result Value Ref Range   WBC 9.8 4.0 - 10.5 K/uL   RBC 4.38 4.22 - 5.81 MIL/uL   Hemoglobin 13.5 13.0 - 17.0 g/dL   HCT 39.7 39.0 - 52.0 %   MCV 90.6 80.0 - 100.0 fL   MCH 30.8 26.0 - 34.0 pg   MCHC 34.0 30.0 - 36.0 g/dL   RDW 14.1 11.5 - 15.5 %   Platelets 170 150 - 400 K/uL   nRBC 0.0 0.0 - 0.2 %    Comment: Performed at [facility], [place] [address], [place], [place] [address]  Comprehensive metabolic panel     Status: Abnormal   Collection Time: 04/23/21  1:48 AM  Result Value Ref Range   Sodium 132 (L) 135 - 145 mmol/L   Potassium 4.3 3.5 - 5.1 mmol/L   Chloride 98 98 - 111 mmol/L   CO2 23 22 - 32 mmol/L   Glucose, Bld 133 (H) 70 - 99 mg/dL    Comment: Glucose reference range applies only to samples taken after fasting for at least 8 hours.   BUN 23 (H) 6 - 20 mg/dL   Creatinine, Ser  2.01 (H) 0.61 - 1.24 mg/dL   Calcium 8.6 (L) 8.9 - 10.3 mg/dL   Total Protein 6.7 6.5 - 8.1 g/dL   Albumin 2.8 (L) 3.5 - 5.0 g/dL   AST 262 (H) 15 - 41 U/L   ALT 561 (H) 0 - 44 U/L   Alkaline Phosphatase 436 (H) 38 - 126 U/L   Total Bilirubin 16.1 (H) 0.3 - 1.2 mg/dL   GFR, Estimated 38 (L) >60 mL/min  Comment: (NOTE) Calculated using the CKD-EPI Creatinine Equation (2021)    Anion gap 11 5 - 15    Comment: Performed at [facility], [place] [address] [place], [place] [address]    CT ABDOMEN PELVIS WO CONTRAST  Result Date: 04/22/2021 CLINICAL DATA:  Abdominal distension and vomiting. EXAM: CT ABDOMEN AND PELVIS WITHOUT CONTRAST TECHNIQUE: Multidetector CT imaging of the abdomen and pelvis was performed following the standard protocol without IV contrast. COMPARISON:  None. FINDINGS: Lower chest: There is minimal atelectasis in both lung bases. Hepatobiliary: Gallstones are present. There is no bile duct dilatation. The liver is within normal limits. Pancreas: Unremarkable. No pancreatic ductal dilatation or surrounding inflammatory changes. Spleen: Normal in size without focal abnormality. Adrenals/Urinary Tract: There is a rounded 12 mm hypodense area in the lower pole the left kidney measuring 33 Hounsfield units. There is a second mildly hyperdense area in the lower pole the left kidney measuring 6 mm. There is no evidence for urinary tract calculus or hydronephrosis in either kidney. Kidneys are normal in size. Adrenal glands are within normal limits. Bladder is decompressed, but otherwise within normal limits. Stomach/Bowel: Stomach is within normal limits. Appendix appears normal. No evidence of bowel wall thickening, distention, or acute inflammatory changes. There is submucosal fat deposition in the transverse colon. Vascular/Lymphatic: Aortic atherosclerosis. No enlarged abdominal or pelvic lymph nodes. Reproductive: Prostate is unremarkable. Other: There are small fat containing  inguinal and umbilical hernias. There is no ascites or free air. Musculoskeletal: No acute or significant osseous findings. IMPRESSION: 1. No acute localizing process in the abdomen or pelvis. 2. Cholelithiasis. 3. There are 2 mildly hyperdense areas in the left kidney which are too small to characterize. Recommend further evaluation with ultrasound or MRI to exclude small solid lesion. 4. Submucosal fat deposition in the transverse colon can be seen with chronic inflammation. No acute inflammatory changes. Electronically Signed   By: Ronney Asters M.D.   On: 04/22/2021 17:44   CT HEAD WO CONTRAST (5MM)  Result Date: 04/22/2021 CLINICAL DATA:  Delirium EXAM: CT HEAD WITHOUT CONTRAST TECHNIQUE: Contiguous axial images were obtained from the base of the skull through the vertex without intravenous contrast. COMPARISON:  None. FINDINGS: Brain: No evidence of acute intracranial hemorrhage or extra-axial collection.No evidence of mass lesion/concern mass effect.The ventricles are normal in size.There is bifrontal mild loss of gray-white matter differentiation (series 4, image 17). Vascular: No hyperdense vessel or unexpected calcification. Skull: Normal. Negative for fracture or focal lesion. Sinuses/Orbits: No acute finding. Other: None. IMPRESSION: Possible focal loss of gray-white matter differentiation along the bifrontal lobes. Recommend MRI for further evaluation. No acute intracranial hemorrhage. These results were called by telephone at the time of interpretation on 04/22/2021 at 5:56 pm to ED provider, who verbally acknowledged these results. Electronically Signed   By: Maurine Simmering M.D.   On: 04/22/2021 17:56   MR BRAIN WO CONTRAST  Result Date: 04/23/2021 CLINICAL DATA:  Initial evaluation for neuro deficit, stroke suspected. Question abnormality on prior head CT. EXAM: MRI HEAD WITHOUT CONTRAST TECHNIQUE: Multiplanar, multiecho pulse sequences of the brain and surrounding structures were obtained  without intravenous contrast. COMPARISON:  Prior head CT from earlier the same day. FINDINGS: Brain: Cerebral volume within normal limits. No significant cerebral white matter disease for age. No abnormal foci of restricted diffusion to suggest acute or subacute ischemia. Gray-white matter differentiation maintained. No encephalomalacia to suggest chronic cortical infarction. No evidence for acute or chronic intracranial hemorrhage. No mass lesion,  mass effect or midline shift. No hydrocephalus or extra-axial fluid collection. Pituitary gland suprasellar region normal. Midline structures intact. Vascular: Major intracranial vascular flow voids are maintained at the skull base. Skull and upper cervical spine: Craniocervical junction within normal limits. Bone marrow signal intensity within normal limits. No scalp soft tissue abnormality. Sinuses/Orbits: Globes and orbital soft tissues demonstrate no acute finding. Mild scattered mucosal thickening noted within the ethmoidal air cells. Paranasal sinuses are otherwise largely clear. No significant mastoid effusion. Other: None. IMPRESSION: Normal brain MRI for age. No acute intracranial abnormality identified. Previously questioned abnormality involving the bifrontal region was artifactual on prior CT. Electronically Signed   By: Jeannine Boga M.D.   On: 04/23/2021 00:32   MR ABDOMEN MRCP WO CONTRAST  Result Date: 04/22/2021 CLINICAL DATA:  Liver failure, renal lesions on CT EXAM: MRI ABDOMEN WITHOUT CONTRAST  (INCLUDING MRCP) TECHNIQUE: Multiplanar multisequence MR imaging of the abdomen was performed. Heavily T2-weighted images of the biliary and pancreatic ducts were obtained, and three-dimensional MRCP images were rendered by post processing. COMPARISON:  CT abdomen/pelvis dated 04/22/2021 FINDINGS: Lower chest: Lung bases are clear. Hepatobiliary: Liver is within normal limits. No hepatic steatosis. No focal hepatic lesion. Gallbladder is unremarkable.  No gallstones, gallbladder wall thickening, or pericholecystic fluid. Central intrahepatic ductal prominence with possible abnormal soft tissue at the hilar confluence measuring 1.9 x 1.8 cm (series 9/image 16), poorly evaluated on unenhanced MR. 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 Common duct is not dilated, measuring 3 mm (series 8/image 17). No choledocholithiasis is seen. Pancreas:  Within normal limits. Spleen:  Within normal limits. Adrenals/Urinary Tract:  Adrenal glands are within normal limits. 16 mm simple exophytic left lower pole renal cyst (series 9/image 35), benign (Bosniak I). 8 mm posterior left lower pole renal cyst with layering hemorrhage (series 9/image 36), benign (Bosniak II). These correspond to the 2 lesions on recent CT. Right kidney is within normal limits.  No hydronephrosis. Stomach/Bowel: Stomach is within normal limits. Visualized bowel is grossly unremarkable. Vascular/Lymphatic:  No evidence of abdominal aortic aneurysm. No suspicious abdominal lymphadenopathy. Other:  No abdominal ascites. Musculoskeletal: No focal osseous lesions. IMPRESSION: Central intrahepatic ductal prominence with possible abnormal soft tissue at the hilar confluence, measuring 1.8 x 1.9 cm. 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 GI consultation is suggested; if follow-up MR is desired, patient may be eligible for contrast with GFR greater than 30. Common duct is within normal limits. No choledocholithiasis is seen. Left lower pole renal cysts noted on recent CT are benign (Bosniak I-II). Electronically Signed   By: Julian Hy M.D.   On: 04/22/2021 23:27    ROS negative except above his cholesterol was raised about 2 years ago Blood pressure 131/75, pulse 60, temperature 98.6 F (37 C), temperature source Oral, resp. rate 18, height  6\' 3"  (1.905 m), weight 107.7 kg, SpO2 96 %. 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 Rush Landmark about an EUS later today just to see if he thinks he can get to that area and if he cannot and creatinine  is slightly better tomorrow would probably recommend repeating the MRCP with contrast and then we can decide how to proceed while we wait on the extra labs to rule out autoimmune etc. and I believe his ferritin is probably an acute phase reactant but if obstruction is ruled out liver biopsy will need to be done in the future and less labs slowly improved with stopping some of his medicines  Austine Kelsay E 04/23/2021, 10:42 AM

## 2021-04-24 DIAGNOSIS — K7200 Acute and subacute hepatic failure without coma: Secondary | ICD-10-CM

## 2021-04-24 LAB — CBC
HCT: 35.3 % — ABNORMAL LOW (ref 39.0–52.0)
Hemoglobin: 12.6 g/dL — ABNORMAL LOW (ref 13.0–17.0)
MCH: 31.3 pg (ref 26.0–34.0)
MCHC: 35.7 g/dL (ref 30.0–36.0)
MCV: 87.8 fL (ref 80.0–100.0)
Platelets: 146 10*3/uL — ABNORMAL LOW (ref 150–400)
RBC: 4.02 MIL/uL — ABNORMAL LOW (ref 4.22–5.81)
RDW: 14.2 % (ref 11.5–15.5)
WBC: 7.7 10*3/uL (ref 4.0–10.5)
nRBC: 0 % (ref 0.0–0.2)

## 2021-04-24 LAB — COMPREHENSIVE METABOLIC PANEL
ALT: 514 U/L — ABNORMAL HIGH (ref 0–44)
AST: 210 U/L — ABNORMAL HIGH (ref 15–41)
Albumin: 2.5 g/dL — ABNORMAL LOW (ref 3.5–5.0)
Alkaline Phosphatase: 469 U/L — ABNORMAL HIGH (ref 38–126)
Anion gap: 9 (ref 5–15)
BUN: 18 mg/dL (ref 6–20)
CO2: 25 mmol/L (ref 22–32)
Calcium: 8.9 mg/dL (ref 8.9–10.3)
Chloride: 98 mmol/L (ref 98–111)
Creatinine, Ser: 1.62 mg/dL — ABNORMAL HIGH (ref 0.61–1.24)
GFR, Estimated: 49 mL/min — ABNORMAL LOW (ref >60)
Glucose, Bld: 113 mg/dL — ABNORMAL HIGH (ref 70–99)
Potassium: 3.5 mmol/L (ref 3.5–5.1)
Sodium: 132 mmol/L — ABNORMAL LOW (ref 135–145)
Total Bilirubin: 17.2 mg/dL — ABNORMAL HIGH (ref 0.3–1.2)
Total Protein: 6.4 g/dL — ABNORMAL LOW (ref 6.5–8.1)

## 2021-04-24 LAB — IRON AND TIBC
Iron: 89 ug/dL (ref 45–182)
Saturation Ratios: 33 % (ref 17.9–39.5)
TIBC: 270 ug/dL (ref 250–450)
UIBC: 181 ug/dL

## 2021-04-24 NOTE — Discharge Summary (Signed)
PATIENT DETAILS Name: Billy Figueroa Age: 60 y.o. Sex: male Date of Birth: 1961/04/09 MRN: [id number]. Admitting Physician: Jonetta Osgood, MD WUJ:[id number], Jori Moll, MD  Admit Date: 04/22/2021 Discharge date: 04/24/2021  Recommendations for Outpatient Follow-up:  Further work-up/evaluation of the jaundice will be done at [facility]@patient /family request. Ceruloplasmin/alpha 1 antitrypsin/ANA/ASMA levels pending-please follow.  Admitted From:  Home  Disposition: [place]: No  Equipment/Devices: None  Discharge Condition: Stable  CODE STATUS: FULL CODE  Diet recommendation:  Diet Order             Diet NPO time specified  Diet effective now           Diet - low sodium heart healthy                    Brief Summary: Patient is a 60 y.o. male with history of HTN, HLD-who was recently diagnosed with jaundice to the ED with nausea/vomiting.  See below for further details.  Pertinent Labs/Radiology: 11/12>> MRI abdomen: Central intrahepatic ductal dilatation with abnormal soft tissue mass at hilar confluence.  Brief Hospital Course: Jaundice- due to ?cholangiocarcinoma: Continues to have significant amount of jaundice-evaluated by GI-unclear whether patient has obstructive jaundice due to cholangiocarcinoma how or this is jaundice due to primary liver issues.  Autoimmune serology pending-Long discussion with patient/spouse this morning-they prefer further work-up/management/treatment to be done at [facility].  Patient being discharged at their request-they plan to go to [facility] today for ongoing evaluation.   AKI: Likely hemodynamically mediated-in the setting of nausea/vomiting/HCTZ losartan use-UA with very minimal proteinuria-renal ultrasound without hydronephrosis.  Renal function has improved.   HTN: BP stable-continue to hold HCTZ/losartan given AKI.  If BP were to be elevated-consider reducing amlodipine until his acute medical issues have stabilized.    HLD: Hold statins given significant amount of transaminitis.   BMI Estimated body mass index is 29.68 kg/m as calculated from the following:   Height as of this encounter: 6\' 3"  (1.905 m).   Weight as of this encounter: 107.7 kg.   Procedures None  Discharge Diagnoses:  Active Problems:   Essential hypertension   Mixed hyperlipidemia   AKI (acute kidney injury) ([place])   Transaminitis   Anxiety   Jaundice   Discharge Instructions:  Activity:  As tolerated   Discharge Instructions     Diet - low sodium heart healthy   Complete by: As directed    Discharge instructions   Complete by: As directed    Follow with Primary MD  [address], MD in 1-2 weeks  Please get a complete blood count and chemistry panel checked by your Primary MD at your next visit, and again as instructed by your Primary MD.  Get Medicines reviewed and adjusted: Please take all your medications with you for your next visit with your Primary MD  Laboratory/radiological data: Please request your Primary MD to go over all hospital tests and procedure/radiological results at the follow up, please ask your Primary MD to get all Hospital records sent to his/her office.  In some cases, they will be blood work, cultures and biopsy results pending at the time of your discharge. Please request that your primary care M.D. follows up on these results.  Also Note the following: If you experience worsening of your admission symptoms, develop shortness of breath, life threatening emergency, suicidal or homicidal thoughts you must seek medical attention immediately by calling 911 or calling your MD immediately  if symptoms less severe.  You must read complete instructions/literature along with all the possible adverse reactions/side effects for all the Medicines you take and that have been prescribed to you. Take any new Medicines after you have completely understood and accpet all the possible adverse  reactions/side effects.   Do not drive when taking Pain medications or sleeping medications (Benzodaizepines)  Do not take more than prescribed Pain, Sleep and Anxiety Medications. It is not advisable to combine anxiety,sleep and pain medications without talking with your primary care practitioner  Special Instructions: If you have smoked or chewed Tobacco  in the last 2 yrs please stop smoking, stop any regular Alcohol  and or any Recreational drug use.  Wear Seat belts while driving.  Please note: You were cared for by a hospitalist during your hospital stay. Once you are discharged, your primary care physician will handle any further medical issues. Please note that NO REFILLS for any discharge medications will be authorized once you are discharged, as it is imperative that you return to your primary care physician (or establish a relationship with a primary care physician if you do not have one) for your post hospital discharge needs so that they can reassess your need for medications and monitor your lab values.   Hold your antihypertensive medications for now-can potentiate further worsening of your kidney failure-once all procedures are complete/no imaging needed-consider restarting at that point.  Please talk to your primary care practitioner about this.  Autoimmune work-up has been sent-it is currently pending-please ask your primary care practitioner to follow-up on these results.   Increase activity slowly   Complete by: As directed       Allergies as of 04/24/2021       Reactions   Other Anaphylaxis, Other (See Comments)   Apples raw throat swelling        Medication List     STOP taking these medications    atorvastatin 20 MG tablet Commonly known as: LIPITOR   losartan-hydrochlorothiazide 50-12.5 MG tablet Commonly known as: HYZAAR       TAKE these medications    desonide 0.05 % ointment Commonly known as: DESOWEN Apply 1-2 application topically 2 (two)  times daily as needed (rash).   levocetirizine 5 MG tablet Commonly known as: XYZAL Take 5 mg by mouth daily as needed for allergies.   PARoxetine 20 MG tablet Commonly known as: PAXIL Take 20 mg by mouth daily.        Follow-up Information     Seward Carol, MD. Schedule an appointment as soon as possible for a visit in 1 week(s).   Specialty: Internal Medicine Contact information: [address] [place] [address] [phone]         Belva Crome, MD Follow up.   Specialty: Cardiology Why: As needed Contact information: [address] [place] [address] [phone]                Allergies  Allergen Reactions   Other Anaphylaxis and Other (See Comments)    Apples raw throat swelling      Consultations:  GI   Other Procedures/Studies: CT ABDOMEN PELVIS WO CONTRAST  Result Date: 04/22/2021 CLINICAL DATA:  Abdominal distension and vomiting. EXAM: CT ABDOMEN AND PELVIS WITHOUT CONTRAST TECHNIQUE: Multidetector CT imaging of the abdomen and pelvis was performed following the standard protocol without IV contrast. COMPARISON:  None. FINDINGS: Lower chest: There is minimal atelectasis in both lung bases. Hepatobiliary: Gallstones are present. There is  no bile duct dilatation. The liver is within normal limits. Pancreas: Unremarkable. No pancreatic ductal dilatation or surrounding inflammatory changes. Spleen: Normal in size without focal abnormality. Adrenals/Urinary Tract: There is a rounded 12 mm hypodense area in the lower pole the left kidney measuring 33 Hounsfield units. There is a second mildly hyperdense area in the lower pole the left kidney measuring 6 mm. There is no evidence for urinary tract calculus or hydronephrosis in either kidney. Kidneys are normal in size. Adrenal glands are within normal limits. Bladder is decompressed, but otherwise within normal limits. Stomach/Bowel: Stomach is within normal  limits. Appendix appears normal. No evidence of bowel wall thickening, distention, or acute inflammatory changes. There is submucosal fat deposition in the transverse colon. Vascular/Lymphatic: Aortic atherosclerosis. No enlarged abdominal or pelvic lymph nodes. Reproductive: Prostate is unremarkable. Other: There are small fat containing inguinal and umbilical hernias. There is no ascites or free air. Musculoskeletal: No acute or significant osseous findings. IMPRESSION: 1. No acute localizing process in the abdomen or pelvis. 2. Cholelithiasis. 3. There are 2 mildly hyperdense areas in the left kidney which are too small to characterize. Recommend further evaluation with ultrasound or MRI to exclude small solid lesion. 4. Submucosal fat deposition in the transverse colon can be seen with chronic inflammation. No acute inflammatory changes. Electronically Signed   By: Ronney Asters M.D.   On: 04/22/2021 17:44   CT HEAD WO CONTRAST (5MM)  Result Date: 04/22/2021 CLINICAL DATA:  Delirium EXAM: CT HEAD WITHOUT CONTRAST TECHNIQUE: Contiguous axial images were obtained from the base of the skull through the vertex without intravenous contrast. COMPARISON:  None. FINDINGS: Brain: No evidence of acute intracranial hemorrhage or extra-axial collection.No evidence of mass lesion/concern mass effect.The ventricles are normal in size.There is bifrontal mild loss of gray-white matter differentiation (series 4, image 17). Vascular: No hyperdense vessel or unexpected calcification. Skull: Normal. Negative for fracture or focal lesion. Sinuses/Orbits: No acute finding. Other: None. IMPRESSION: Possible focal loss of gray-white matter differentiation along the bifrontal lobes. Recommend MRI for further evaluation. No acute intracranial hemorrhage. These results were called by telephone at the time of interpretation on 04/22/2021 at 5:56 pm to ED provider, who verbally acknowledged these results. Electronically Signed   By:  Maurine Simmering M.D.   On: 04/22/2021 17:56   MR BRAIN WO CONTRAST  Result Date: 04/23/2021 CLINICAL DATA:  Initial evaluation for neuro deficit, stroke suspected. Question abnormality on prior head CT. EXAM: MRI HEAD WITHOUT CONTRAST TECHNIQUE: Multiplanar, multiecho pulse sequences of the brain and surrounding structures were obtained without intravenous contrast. COMPARISON:  Prior head CT from earlier the same day. FINDINGS: Brain: Cerebral volume within normal limits. No significant cerebral white matter disease for age. No abnormal foci of restricted diffusion to suggest acute or subacute ischemia. Gray-white matter differentiation maintained. No encephalomalacia to suggest chronic cortical infarction. No evidence for acute or chronic intracranial hemorrhage. No mass lesion, mass effect or midline shift. No hydrocephalus or extra-axial fluid collection. Pituitary gland suprasellar region normal. Midline structures intact. Vascular: Major intracranial vascular flow voids are maintained at the skull base. Skull and upper cervical spine: Craniocervical junction within normal limits. Bone marrow signal intensity within normal limits. No scalp soft tissue abnormality. Sinuses/Orbits: Globes and orbital soft tissues demonstrate no acute finding. Mild scattered mucosal thickening noted within the ethmoidal air cells. Paranasal sinuses are otherwise largely clear. No significant mastoid effusion. Other: None. IMPRESSION: Normal brain MRI for age. No acute intracranial abnormality identified. Previously questioned abnormality  involving the bifrontal region was artifactual on prior CT. Electronically Signed   By: Jeannine Boga M.D.   On: 04/23/2021 00:32   US Abdomen Complete  Result Date: 04/18/2021 CLINICAL DATA:  Elevated LFTs and dark urine. EXAM: ABDOMEN ULTRASOUND COMPLETE COMPARISON:  None. FINDINGS: Gallbladder: Contracted. No gallstones or wall thickening visualized. No sonographic Murphy sign noted  by sonographer. Common bile duct: Diameter: 2 mm, normal. Liver: No focal lesion identified. Increased in parenchymal echogenicity. Portal vein is patent on color Doppler imaging with normal direction of blood flow towards the liver. IVC: No abnormality visualized. Pancreas: Visualized portion unremarkable. Spleen: Size and appearance within normal limits. Right Kidney: Length: 13.4 cm. Echogenicity within normal limits. No mass or hydronephrosis visualized. Left Kidney: Length: 12.3 cm. Echogenicity within normal limits. No mass or hydronephrosis visualized. 1.5 cm exophytic simple cyst arising from the lower pole. Abdominal aorta: No aneurysm visualized. Other findings: None. IMPRESSION: 1. No acute abnormality. 2. Increased parenchymal echogenicity of the liver suggests fatty infiltration. Electronically Signed   By: Titus Dubin M.D.   On: 04/18/2021 15:47   MR ABDOMEN MRCP WO CONTRAST  Result Date: 04/22/2021 CLINICAL DATA:  Liver failure, renal lesions on CT EXAM: MRI ABDOMEN WITHOUT CONTRAST  (INCLUDING MRCP) TECHNIQUE: Multiplanar multisequence MR imaging of the abdomen was performed. Heavily T2-weighted images of the biliary and pancreatic ducts were obtained, and three-dimensional MRCP images were rendered by post processing. COMPARISON:  CT abdomen/pelvis dated 04/22/2021 FINDINGS: Lower chest: Lung bases are clear. Hepatobiliary: Liver is within normal limits. No hepatic steatosis. No focal hepatic lesion. Gallbladder is unremarkable. No gallstones, gallbladder wall thickening, or pericholecystic fluid. Central intrahepatic ductal prominence with possible abnormal soft tissue at the hilar confluence measuring 1.9 x 1.8 cm (series 9/image 16), poorly evaluated on unenhanced MR. 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 Common duct is not dilated, measuring 3 mm (series 8/image 17). No choledocholithiasis is seen. Pancreas:  Within normal  limits. Spleen:  Within normal limits. Adrenals/Urinary Tract:  Adrenal glands are within normal limits. 16 mm simple exophytic left lower pole renal cyst (series 9/image 35), benign (Bosniak I). 8 mm posterior left lower pole renal cyst with layering hemorrhage (series 9/image 36), benign (Bosniak II). These correspond to the 2 lesions on recent CT. Right kidney is within normal limits.  No hydronephrosis. Stomach/Bowel: Stomach is within normal limits. Visualized bowel is grossly unremarkable. Vascular/Lymphatic:  No evidence of abdominal aortic aneurysm. No suspicious abdominal lymphadenopathy. Other:  No abdominal ascites. Musculoskeletal: No focal osseous lesions. IMPRESSION: Central intrahepatic ductal prominence with possible abnormal soft tissue at the hilar confluence, measuring 1.8 x 1.9 cm. 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 GI consultation is suggested; if follow-up MR is desired, patient may be eligible for contrast with GFR greater than 30. Common duct is within normal limits. No choledocholithiasis is seen. Left lower pole renal cysts noted on recent CT are benign (Bosniak I-II). Electronically Signed   By: Julian Hy M.D.   On: 04/22/2021 23:27     TODAY-DAY OF DISCHARGE:  Subjective:   Billy Figueroa today has no headache,no chest abdominal pain,no new weakness tingling or numbness, feels much better wants to go home today.   Objective:   Blood pressure 130/84, pulse 64, temperature 98.6 F (37 C), temperature source Oral, resp. rate 17, height 6\' 3"  (1.905 m), weight 107.7 kg, SpO2 98 %.  Intake/Output Summary (Last 24 hours) at 04/24/2021 1204 Last data filed at 04/23/2021 2000 Gross per 24 hour  Intake 717.65 ml  Output 1450 ml  Net -732.35 ml   Filed Weights   04/23/21 0101 04/23/21 0457  Weight: 107.9 kg 107.7 kg    Exam: Awake Alert, Oriented *3,  No new F.N deficits, Normal affect [place].AT,PERRAL Supple Neck,No JVD, No cervical lymphadenopathy appriciated.  Symmetrical Chest wall movement, Good air movement bilaterally, CTAB RRR,No Gallops,Rubs or new Murmurs, No Parasternal Heave +ve B.Sounds, Abd Soft, Non tender, No organomegaly appriciated, No rebound -guarding or rigidity. No Cyanosis, Clubbing or edema, No new Rash or bruise   PERTINENT RADIOLOGIC STUDIES: CT ABDOMEN PELVIS WO CONTRAST  Result Date: 04/22/2021 CLINICAL DATA:  Abdominal distension and vomiting. EXAM: CT ABDOMEN AND PELVIS WITHOUT CONTRAST TECHNIQUE: Multidetector CT imaging of the abdomen and pelvis was performed following the standard protocol without IV contrast. COMPARISON:  None. FINDINGS: Lower chest: There is minimal atelectasis in both lung bases. Hepatobiliary: Gallstones are present. There is no bile duct dilatation. The liver is within normal limits. Pancreas: Unremarkable. No pancreatic ductal dilatation or surrounding inflammatory changes. Spleen: Normal in size without focal abnormality. Adrenals/Urinary Tract: There is a rounded 12 mm hypodense area in the lower pole the left kidney measuring 33 Hounsfield units. There is a second mildly hyperdense area in the lower pole the left kidney measuring 6 mm. There is no evidence for urinary tract calculus or hydronephrosis in either kidney. Kidneys are normal in size. Adrenal glands are within normal limits. Bladder is decompressed, but otherwise within normal limits. Stomach/Bowel: Stomach is within normal limits. Appendix appears normal. No evidence of bowel wall thickening, distention, or acute inflammatory changes. There is submucosal fat deposition in the transverse colon. Vascular/Lymphatic: Aortic atherosclerosis. No enlarged abdominal or pelvic lymph nodes. Reproductive: Prostate is unremarkable. Other: There are small fat containing inguinal and umbilical hernias. There is no ascites or free air.  Musculoskeletal: No acute or significant osseous findings. IMPRESSION: 1. No acute localizing process in the abdomen or pelvis. 2. Cholelithiasis. 3. There are 2 mildly hyperdense areas in the left kidney which are too small to characterize. Recommend further evaluation with ultrasound or MRI to exclude small solid lesion. 4. Submucosal fat deposition in the transverse colon can be seen with chronic inflammation. No acute inflammatory changes. Electronically Signed   By: Ronney Asters M.D.   On: 04/22/2021 17:44   CT HEAD WO CONTRAST (5MM)  Result Date: 04/22/2021 CLINICAL DATA:  Delirium EXAM: CT HEAD WITHOUT CONTRAST TECHNIQUE: Contiguous axial images were obtained from the base of the skull through the vertex without intravenous contrast. COMPARISON:  None. FINDINGS: Brain: No evidence of acute intracranial hemorrhage or extra-axial collection.No evidence of mass lesion/concern mass effect.The ventricles are normal in size.There is bifrontal mild loss of gray-white matter differentiation (series 4, image 17). Vascular: No hyperdense vessel or unexpected calcification. Skull: Normal. Negative for fracture or focal lesion. Sinuses/Orbits: No acute finding. Other: None. IMPRESSION: Possible focal loss of gray-white matter differentiation along the bifrontal lobes. Recommend MRI for further evaluation. No acute intracranial hemorrhage. These results were called by telephone at the time of interpretation on 04/22/2021 at 5:56 pm to ED provider, who verbally acknowledged these results. Electronically Signed   By: Maurine Simmering M.D.   On: 04/22/2021 17:56   MR BRAIN WO CONTRAST  Result Date: 04/23/2021 CLINICAL DATA:  Initial evaluation for neuro deficit, stroke suspected. Question abnormality on prior head CT. EXAM: MRI HEAD  WITHOUT CONTRAST TECHNIQUE: Multiplanar, multiecho pulse sequences of the brain and surrounding structures were obtained without intravenous contrast. COMPARISON:  Prior head CT from  earlier the same day. FINDINGS: Brain: Cerebral volume within normal limits. No significant cerebral white matter disease for age. No abnormal foci of restricted diffusion to suggest acute or subacute ischemia. Gray-white matter differentiation maintained. No encephalomalacia to suggest chronic cortical infarction. No evidence for acute or chronic intracranial hemorrhage. No mass lesion, mass effect or midline shift. No hydrocephalus or extra-axial fluid collection. Pituitary gland suprasellar region normal. Midline structures intact. Vascular: Major intracranial vascular flow voids are maintained at the skull base. Skull and upper cervical spine: Craniocervical junction within normal limits. Bone marrow signal intensity within normal limits. No scalp soft tissue abnormality. Sinuses/Orbits: Globes and orbital soft tissues demonstrate no acute finding. Mild scattered mucosal thickening noted within the ethmoidal air cells. Paranasal sinuses are otherwise largely clear. No significant mastoid effusion. Other: None. IMPRESSION: Normal brain MRI for age. No acute intracranial abnormality identified. Previously questioned abnormality involving the bifrontal region was artifactual on prior CT. Electronically Signed   By: Jeannine Boga M.D.   On: 04/23/2021 00:32   MR ABDOMEN MRCP WO CONTRAST  Result Date: 04/22/2021 CLINICAL DATA:  Liver failure, renal lesions on CT EXAM: MRI ABDOMEN WITHOUT CONTRAST  (INCLUDING MRCP) TECHNIQUE: Multiplanar multisequence MR imaging of the abdomen was performed. Heavily T2-weighted images of the biliary and pancreatic ducts were obtained, and three-dimensional MRCP images were rendered by post processing. COMPARISON:  CT abdomen/pelvis dated 04/22/2021 FINDINGS: Lower chest: Lung bases are clear. Hepatobiliary: Liver is within normal limits. No hepatic steatosis. No focal hepatic lesion. Gallbladder is unremarkable. No gallstones, gallbladder wall thickening, or  pericholecystic fluid. Central intrahepatic ductal prominence with possible abnormal soft tissue at the hilar confluence measuring 1.9 x 1.8 cm (series 9/image 16), poorly evaluated on unenhanced MR. 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 Common duct is not dilated, measuring 3 mm (series 8/image 17). No choledocholithiasis is seen. Pancreas:  Within normal limits. Spleen:  Within normal limits. Adrenals/Urinary Tract:  Adrenal glands are within normal limits. 16 mm simple exophytic left lower pole renal cyst (series 9/image 35), benign (Bosniak I). 8 mm posterior left lower pole renal cyst with layering hemorrhage (series 9/image 36), benign (Bosniak II). These correspond to the 2 lesions on recent CT. Right kidney is within normal limits.  No hydronephrosis. Stomach/Bowel: Stomach is within normal limits. Visualized bowel is grossly unremarkable. Vascular/Lymphatic:  No evidence of abdominal aortic aneurysm. No suspicious abdominal lymphadenopathy. Other:  No abdominal ascites. Musculoskeletal: No focal osseous lesions. IMPRESSION: Central intrahepatic ductal prominence with possible abnormal soft tissue at the hilar confluence, measuring 1.8 x 1.9 cm. 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 GI consultation is suggested; if follow-up MR is desired, patient may be eligible for contrast with GFR greater than 30. Common duct is within normal limits. No choledocholithiasis is seen. Left lower pole renal cysts noted on recent CT are benign (Bosniak I-II). Electronically Signed   By: Julian Hy M.D.   On: 04/22/2021 23:27     PERTINENT LAB RESULTS: CBC: Recent Labs    04/23/21 0148 04/24/21 0147  WBC 9.8 7.7  HGB 13.5 12.6*  HCT 39.7 35.3*  PLT 170 146*   CMET CMP     Component Value Date/Time   NA 132 (L) 04/24/2021 0147  NA 140  10/01/2019 0900   K 3.5 04/24/2021 0147   CL 98 04/24/2021 0147   CO2 25 04/24/2021 0147   GLUCOSE 113 (H) 04/24/2021 0147   BUN 18 04/24/2021 0147   BUN 13 10/01/2019 0900   CREATININE 1.62 (H) 04/24/2021 0147   CALCIUM 8.9 04/24/2021 0147   PROT 6.4 (L) 04/24/2021 0147   PROT 6.9 10/01/2019 0900   ALBUMIN 2.5 (L) 04/24/2021 0147   ALBUMIN 4.0 10/01/2019 0900   AST 210 (H) 04/24/2021 0147   ALT 514 (H) 04/24/2021 0147   ALKPHOS 469 (H) 04/24/2021 0147   BILITOT 17.2 (H) 04/24/2021 0147   BILITOT 0.5 10/01/2019 0900   GFRNONAA 49 (L) 04/24/2021 0147   GFRAA 73 10/01/2019 0900    GFR Estimated Creatinine Clearance: 65.9 mL/min (A) (by C-G formula based on SCr of 1.62 mg/dL (H)). Recent Labs    04/22/21 1429  LIPASE 35   No results for input(s): CKTOTAL, CKMB, CKMBINDEX, TROPONINI in the last 72 hours. Invalid input(s): POCBNP No results for input(s): DDIMER in the last 72 hours. No results for input(s): HGBA1C in the last 72 hours. No results for input(s): CHOL, HDL, LDLCALC, TRIG, CHOLHDL, LDLDIRECT in the last 72 hours. Recent Labs    04/22/21 2030  TSH 1.251   Recent Labs    04/22/21 1902 04/23/21 0805  FERRITIN 1,444*  --   TIBC  --  270  IRON  --  89   Coags: Recent Labs    04/22/21 1920  INR 1.2   Microbiology: Recent Results (from the past 240 hour(s))  Resp Panel by RT-PCR (Flu A&B, Covid) Nasopharyngeal Swab     Status: None   Collection Time: 04/22/21  4:20 PM   Specimen: Nasopharyngeal Swab; Nasopharyngeal(NP) swabs in vial transport medium  Result Value Ref Range Status   SARS Coronavirus 2 by RT PCR NEGATIVE NEGATIVE Final    Comment: (NOTE) SARS-CoV-2 target nucleic acids are NOT DETECTED.  The SARS-CoV-2 RNA is generally detectable in upper respiratory specimens during the acute phase of infection. The lowest concentration of SARS-CoV-2 viral copies this assay can detect is 138 copies/mL. A negative result does not preclude  SARS-Cov-2 infection and should not be used as the sole basis for treatment or other patient management decisions. A negative result may occur with  improper specimen collection/handling, submission of specimen other than nasopharyngeal swab, presence of viral mutation(s) within the areas targeted by this assay, and inadequate number of viral copies(<138 copies/mL). A negative result must be combined with clinical observations, patient history, and epidemiological information. The expected result is Negative.  Fact Sheet for Patients:  EntrepreneurPulse.com.au  Fact Sheet for Healthcare Providers:  IncredibleEmployment.be  This test is no t yet approved or cleared by the [place] FDA and  has been authorized for detection and/or diagnosis of SARS-CoV-2 by FDA under an Emergency Use Authorization (EUA). This EUA will remain  in effect (meaning this test can be used) for the duration of the COVID-19 declaration under Section 564(b)(1) of the Act, 21 U.S.C.section 360bbb-3(b)(1), unless the authorization is terminated  or revoked sooner.       Influenza A by PCR NEGATIVE NEGATIVE Final   Influenza B by PCR NEGATIVE NEGATIVE Final    Comment: (NOTE) The Xpert Xpress SARS-CoV-2/FLU/RSV plus assay is intended as an aid in the diagnosis of influenza from Nasopharyngeal swab specimens and should not be used as a sole basis for treatment. Nasal washings and aspirates are unacceptable for Xpert  Xpress SARS-CoV-2/FLU/RSV testing.  Fact Sheet for Patients: EntrepreneurPulse.com.au  Fact Sheet for Healthcare Providers: IncredibleEmployment.be  This test is not yet approved or cleared by the [place] FDA and has been authorized for detection and/or diagnosis of SARS-CoV-2 by FDA under an Emergency Use Authorization (EUA). This EUA will remain in effect (meaning this test can be used) for the duration of  the COVID-19 declaration under Section 564(b)(1) of the Act, 21 U.S.C. section 360bbb-3(b)(1), unless the authorization is terminated or revoked.  Performed at [facility], [place] [address] [place], [place] [address]     FURTHER DISCHARGE INSTRUCTIONS:  Get Medicines reviewed and adjusted: Please take all your medications with you for your next visit with your Primary MD  Laboratory/radiological data: Please request your Primary MD to go over all hospital tests and procedure/radiological results at the follow up, please ask your Primary MD to get all Hospital records sent to his/her office.  In some cases, they will be blood work, cultures and biopsy results pending at the time of your discharge. Please request that your primary care M.D. goes through all the records of your hospital data and follows up on these results.  Also Note the following: If you experience worsening of your admission symptoms, develop shortness of breath, life threatening emergency, suicidal or homicidal thoughts you must seek medical attention immediately by calling 911 or calling your MD immediately  if symptoms less severe.  You must read complete instructions/literature along with all the possible adverse reactions/side effects for all the Medicines you take and that have been prescribed to you. Take any new Medicines after you have completely understood and accpet all the possible adverse reactions/side effects.   Do not drive when taking Pain medications or sleeping medications (Benzodaizepines)  Do not take more than prescribed Pain, Sleep and Anxiety Medications. It is not advisable to combine anxiety,sleep and pain medications without talking with your primary care practitioner  Special Instructions: If you have smoked or chewed Tobacco  in the last 2 yrs please stop smoking, stop any regular Alcohol  and or any Recreational drug use.  Wear Seat belts while driving.  Please note: You were  cared for by a hospitalist during your hospital stay. Once you are discharged, your primary care physician will handle any further medical issues. Please note that NO REFILLS for any discharge medications will be authorized once you are discharged, as it is imperative that you return to your primary care physician (or establish a relationship with a primary care physician if you do not have one) for your post hospital discharge needs so that they can reassess your need for medications and monitor your lab values.  Total Time spent coordinating discharge including counseling, education and face to face time equals 35 minutes.  SignedOren Binet 04/24/2021 12:04 PM

## 2021-04-24 NOTE — Progress Notes (Signed)
Long discussion with patient/spouse at bedside-they are planning to go to [facility] for further work-up/evaluation and management of his jaundice.  Hence will defer any further work-up here.  I have alerted Dr. Paulita Fujita as well.  See discharge summary for details.

## 2021-04-24 NOTE — Progress Notes (Signed)
[facility] Gastroenterology Progress Note  Billy Figueroa 60 y.o. 03-Jan-1961  CC:  Abnormal liver function  Subjective: Patient lying comfortably in bed, had just eaten half of a cookie had not had breakfast. States he had a fever last night, 100.7, this is resolved states he is feeling better. Denies nausea, vomiting, abdominal pain. Patient had small volume formed stool last night, denies melena or bright red blood.  States however gray-colored  ROS :  Review of Systems  Constitutional:  Negative for chills and fever.  Respiratory:  Negative for shortness of breath.   Cardiovascular:  Positive for leg swelling (unchanged for patient). Negative for chest pain.  Gastrointestinal:  Negative for abdominal pain, blood in stool, constipation, diarrhea, heartburn, melena, nausea and vomiting.  Skin:  Negative for itching.   Objective: Vital signs in last 24 hours: Vitals:   04/24/21 0355 04/24/21 0728  BP: 121/81 130/84  Pulse: 69 64  Resp: 18 17  Temp: 97.6 F (36.4 C) 98.6 F (37 C)  SpO2: 100% 98%    Physical Exam: General Alert, in NAD. + scleral icterus. Abdomen: Soft. Nontender. Nondistended. Normal bowel sounds. No rebound or guarding.  Skin: + jaundice. No palmar erythema or spider angioma. Neuro Alert and oriented x4;  grossly normal neurologically; no asterixis or clonus.   Lab Results: Recent Labs    04/23/21 0148 04/24/21 0147  NA 132* 132*  K 4.3 3.5  CL 98 98  CO2 23 25  GLUCOSE 133* 113*  BUN 23* 18  CREATININE 2.01* 1.62*  CALCIUM 8.6* 8.9   Recent Labs    04/23/21 0148 04/24/21 0147  AST 262* 210*  ALT 561* 514*  ALKPHOS 436* 469*  BILITOT 16.1* 17.2*  PROT 6.7 6.4*  ALBUMIN 2.8* 2.5*   Recent Labs    04/22/21 1440 04/22/21 1743 04/23/21 0148 04/24/21 0147  WBC 10.7*  --  9.8 7.7  NEUTROABS 8.6*  --   --   --   HGB 14.6   < > 13.5 12.6*  HCT 42.7   < > 39.7 35.3*  MCV 91.0  --  90.6 87.8  PLT 199  --  170 146*   < > = values in this  interval not displayed.   Recent Labs    04/22/21 1920  LABPROT 14.7  INR 1.2    Lab Results: Results for orders placed or performed during the hospital encounter of 04/22/21 (from the past 48 hour(s))  Lipase, blood     Status: None   Collection Time: 04/22/21  2:29 PM  Result Value Ref Range   Lipase 35 11 - 51 U/L    Comment: Performed at [facility], [place] [address] [place], [place] [address]  Comprehensive metabolic panel     Status: Abnormal   Collection Time: 04/22/21  2:29 PM  Result Value Ref Range   Sodium 134 (L) 135 - 145 mmol/L   Potassium 3.9 3.5 - 5.1 mmol/L   Chloride 101 98 - 111 mmol/L   CO2 22 22 - 32 mmol/L   Glucose, Bld 135 (H) 70 - 99 mg/dL    Comment: Glucose reference range applies only to samples taken after fasting for at least 8 hours.   BUN 17 6 - 20 mg/dL   Creatinine, Ser 2.06 (H) 0.61 - 1.24 mg/dL   Calcium 9.6 8.9 - 10.3 mg/dL   Total Protein 7.9 6.5 - 8.1 g/dL   Albumin 3.3 (L) 3.5 - 5.0 g/dL   AST 394 (  H) 15 - 41 U/L   ALT 732 (H) 0 - 44 U/L   Alkaline Phosphatase 491 (H) 38 - 126 U/L   Total Bilirubin 17.5 (H) 0.3 - 1.2 mg/dL   GFR, Estimated 37 (L) >60 mL/min    Comment: (NOTE) Calculated using the CKD-EPI Creatinine Equation (2021)    Anion gap 11 5 - 15    Comment: Performed at [place] [address] [place], [place] [address]  CBC with Differential     Status: Abnormal   Collection Time: 04/22/21  2:40 PM  Result Value Ref Range   WBC 10.7 (H) 4.0 - 10.5 K/uL   RBC 4.69 4.22 - 5.81 MIL/uL   Hemoglobin 14.6 13.0 - 17.0 g/dL   HCT 42.7 39.0 - 52.0 %   MCV 91.0 80.0 - 100.0 fL   MCH 31.1 26.0 - 34.0 pg   MCHC 34.2 30.0 - 36.0 g/dL   RDW 13.8 11.5 - 15.5 %   Platelets 199 150 - 400 K/uL   nRBC 0.0 0.0 - 0.2 %   Neutrophils Relative % 79 %   Neutro Abs 8.6 (H) 1.7 - 7.7 K/uL   Lymphocytes Relative 10 %   Lymphs Abs 1.0 0.7 - 4.0 K/uL   Monocytes Relative 7 %   Monocytes Absolute 0.7 0.1 - 1.0 K/uL    Eosinophils Relative 3 %   Eosinophils Absolute 0.3 0.0 - 0.5 K/uL   Basophils Relative 0 %   Basophils Absolute 0.0 0.0 - 0.1 K/uL   Immature Granulocytes 1 %   Abs Immature Granulocytes 0.05 0.00 - 0.07 K/uL    Comment: Performed at [facility] [address] [place], [place] [address]  Resp Panel by RT-PCR (Flu A&B, Covid) Nasopharyngeal Swab     Status: None   Collection Time: 04/22/21  4:20 PM   Specimen: Nasopharyngeal Swab; Nasopharyngeal(NP) swabs in vial transport medium  Result Value Ref Range   SARS Coronavirus 2 by RT PCR NEGATIVE NEGATIVE    Comment: (NOTE) SARS-CoV-2 target nucleic acids are NOT DETECTED.  The SARS-CoV-2 RNA is generally detectable in upper respiratory specimens during the acute phase of infection. The lowest concentration of SARS-CoV-2 viral copies this assay can detect is 138 copies/mL. A negative result does not preclude SARS-Cov-2 infection and should not be used as the sole basis for treatment or other patient management decisions. A negative result may occur with  improper specimen collection/handling, submission of specimen other than nasopharyngeal swab, presence of viral mutation(s) within the areas targeted by this assay, and inadequate number of viral copies(<138 copies/mL). A negative result must be combined with clinical observations, patient history, and epidemiological information. The expected result is Negative.  Fact Sheet for Patients:  EntrepreneurPulse.com.au  Fact Sheet for Healthcare Providers:  IncredibleEmployment.be  This test is no t yet approved or cleared by the [place] FDA and  has been authorized for detection and/or diagnosis of SARS-CoV-2 by FDA under an Emergency Use Authorization (EUA). This EUA will remain  in effect (meaning this test can be used) for the duration of the COVID-19 declaration under Section 564(b)(1) of the Act, 21 U.S.C.section 360bbb-3(b)(1),  unless the authorization is terminated  or revoked sooner.       Influenza A by PCR NEGATIVE NEGATIVE   Influenza B by PCR NEGATIVE NEGATIVE    Comment: (NOTE) The Xpert Xpress SARS-CoV-2/FLU/RSV plus assay is intended as an aid in the diagnosis of influenza from Nasopharyngeal swab specimens and  should not be used as a sole basis for treatment. Nasal washings and aspirates are unacceptable for Xpert Xpress SARS-CoV-2/FLU/RSV testing.  Fact Sheet for Patients: EntrepreneurPulse.com.au  Fact Sheet for Healthcare Providers: IncredibleEmployment.be  This test is not yet approved or cleared by the [place] FDA and has been authorized for detection and/or diagnosis of SARS-CoV-2 by FDA under an Emergency Use Authorization (EUA). This EUA will remain in effect (meaning this test can be used) for the duration of the COVID-19 declaration under Section 564(b)(1) of the Act, 21 U.S.C. section 360bbb-3(b)(1), unless the authorization is terminated or revoked.  Performed at [facility], [place] [address], [place], [place] [address]   Ammonia     Status: Abnormal   Collection Time: 04/22/21  5:32 PM  Result Value Ref Range   Ammonia 61 (H) 9 - 35 umol/L    Comment: Performed at [facility] [address] [place], [place] [address]  Acetaminophen level     Status: Abnormal   Collection Time: 04/22/21  5:32 PM  Result Value Ref Range   Acetaminophen (Tylenol), Serum <10 (L) 10 - 30 ug/mL    Comment: (NOTE) Therapeutic concentrations vary significantly. A range of 10-30 ug/mL  may be an effective concentration for many patients. However, some  are best treated at concentrations outside of this range. Acetaminophen concentrations >150 ug/mL at 4 hours after ingestion  and >50 ug/mL at 12 hours after ingestion are often associated with  toxic reactions.  Performed at [facility], [place] [address] [place], [place] [address]    Salicylate level     Status: None   Collection Time: 04/22/21  5:32 PM  Result Value Ref Range   Salicylate Lvl 8.3 7.0 - 30.0 mg/dL    Comment: Performed at [facility] [address] [place], [place] [address]  Osmolality     Status: None   Collection Time: 04/22/21  5:32 PM  Result Value Ref Range   Osmolality 291 275 - 295 mOsm/kg    Comment: Performed at [facility], [place] [address], [place], [place] [address]  Bilirubin, direct     Status: Abnormal   Collection Time: 04/22/21  5:32 PM  Result Value Ref Range   Bilirubin, Direct 9.9 (H) 0.0 - 0.2 mg/dL    Comment: Performed at [facility] [place] [address] [place], [place] [address]  I-Stat venous blood gas, ED     Status: Abnormal   Collection Time: 04/22/21  5:43 PM  Result Value Ref Range   pH, Ven 7.422 7.250 - 7.430   pCO2, Ven 46.5 44.0 - 60.0 mmHg   pO2, Ven 26.0 (LL) 32.0 - 45.0 mmHg   Bicarbonate 30.3 (H) 20.0 - 28.0 mmol/L   TCO2 32 22 - 32 mmol/L   O2 Saturation 49.0 %   Acid-Base Excess 5.0 (H) 0.0 - 2.0 mmol/L   Sodium 136 135 - 145 mmol/L   Potassium 4.6 3.5 - 5.1 mmol/L   Calcium, Ion 1.08 (L) 1.15 - 1.40 mmol/L   HCT 45.0 39.0 - 52.0 %   Hemoglobin 15.3 13.0 - 17.0 g/dL   Sample type VENOUS    Comment NOTIFIED PHYSICIAN   Ferritin     Status: Abnormal   Collection Time: 04/22/21  7:02 PM  Result Value Ref Range   Ferritin 1,444 (H) 24 - 336 ng/mL    Comment: Performed at [facility], [place],  [place] [address]  HIV Antibody (routine testing w rflx)     Status: None   Collection Time: 04/22/21  7:02 PM  Result Value Ref Range   HIV Screen 4th Generation wRfx Non Reactive Non Reactive    Comment: Performed at [facility], [place] [address], [place], [place] [address]  Protime-INR     Status: None   Collection Time: 04/22/21  7:20 PM  Result Value Ref Range   Prothrombin Time 14.7 11.4 - 15.2 seconds   INR 1.2 0.8 - 1.2    Comment: (NOTE) INR goal  varies based on device and disease states. Performed at [facility], [place] [address], [place], [place] [address]   TSH     Status: None   Collection Time: 04/22/21  8:30 PM  Result Value Ref Range   TSH 1.251 0.350 - 4.500 uIU/mL    Comment: Performed by a 3rd Generation assay with a functional sensitivity of <=0.01 uIU/mL. Performed at [facility], [place] [address] [place], [place] [address]   Urinalysis, Routine w reflex microscopic     Status: Abnormal   Collection Time: 04/22/21 10:03 PM  Result Value Ref Range   Color, Urine AMBER (A) YELLOW    Comment: BIOCHEMICALS MAY BE AFFECTED BY COLOR   APPearance HAZY (A) CLEAR   Specific Gravity, Urine 1.010 1.005 - 1.030   pH 5.0 5.0 - 8.0   Glucose, UA NEGATIVE NEGATIVE mg/dL   Hgb urine dipstick SMALL (A) NEGATIVE   Bilirubin Urine MODERATE (A) NEGATIVE   Ketones, ur NEGATIVE NEGATIVE mg/dL   Protein, ur 30 (A) NEGATIVE mg/dL   Nitrite NEGATIVE NEGATIVE   Leukocytes,Ua NEGATIVE NEGATIVE   RBC / HPF 0-5 0 - 5 RBC/hpf   WBC, UA 6-10 0 - 5 WBC/hpf   Bacteria, UA FEW (A) NONE SEEN   Squamous Epithelial / LPF 6-10 0 - 5   Mucus PRESENT    Hyaline Casts, UA PRESENT    Granular Casts, UA PRESENT     Comment: Performed at [facility], [address] [place] [address]  CBC     Status: None   Collection Time: 04/23/21  1:48 AM  Result Value Ref Range   WBC 9.8 4.0 - 10.5 K/uL   RBC 4.38 4.22 - 5.81 MIL/uL   Hemoglobin 13.5 13.0 - 17.0 g/dL   HCT 39.7 39.0 - 52.0 %   MCV 90.6 80.0 - 100.0 fL   MCH 30.8 26.0 - 34.0 pg   MCHC 34.0 30.0 - 36.0 g/dL   RDW 14.1 11.5 - 15.5 %   Platelets 170 150 - 400 K/uL   nRBC 0.0 0.0 - 0.2 %    Comment: Performed at [facility], [place] [address], [place],  [address]  Comprehensive metabolic panel     Status: Abnormal   Collection Time: 04/23/21  1:48 AM  Result Value Ref Range   Sodium 132 (L) 135 - 145 mmol/L   Potassium 4.3 3.5 - 5.1 mmol/L   Chloride  98 98 - 111 mmol/L   CO2 23 22 - 32 mmol/L   Glucose, Bld 133 (H) 70 - 99 mg/dL    Comment: Glucose reference range applies only to samples taken after fasting for at least 8 hours.   BUN 23 (H) 6 - 20 mg/dL   Creatinine, Ser 2.01 (H) 0.61 - 1.24 mg/dL   Calcium 8.6 (L) 8.9 - 10.3 mg/dL   Total  Protein 6.7 6.5 - 8.1 g/dL   Albumin 2.8 (L) 3.5 - 5.0 g/dL   AST 262 (H) 15 - 41 U/L   ALT 561 (H) 0 - 44 U/L   Alkaline Phosphatase 436 (H) 38 - 126 U/L   Total Bilirubin 16.1 (H) 0.3 - 1.2 mg/dL   GFR, Estimated 38 (L) >60 mL/min    Comment: (NOTE) Calculated using the CKD-EPI Creatinine Equation (2021)    Anion gap 11 5 - 15    Comment: Performed at [facility] [address], [place], [place] [address]  Comprehensive metabolic panel     Status: Abnormal   Collection Time: 04/24/21  1:47 AM  Result Value Ref Range   Sodium 132 (L) 135 - 145 mmol/L   Potassium 3.5 3.5 - 5.1 mmol/L    Comment: DELTA CHECK NOTED   Chloride 98 98 - 111 mmol/L   CO2 25 22 - 32 mmol/L   Glucose, Bld 113 (H) 70 - 99 mg/dL    Comment: Glucose reference range applies only to samples taken after fasting for at least 8 hours.   BUN 18 6 - 20 mg/dL   Creatinine, Ser 1.62 (H) 0.61 - 1.24 mg/dL   Calcium 8.9 8.9 - 10.3 mg/dL   Total Protein 6.4 (L) 6.5 - 8.1 g/dL   Albumin 2.5 (L) 3.5 - 5.0 g/dL   AST 210 (H) 15 - 41 U/L   ALT 514 (H) 0 - 44 U/L   Alkaline Phosphatase 469 (H) 38 - 126 U/L   Total Bilirubin 17.2 (H) 0.3 - 1.2 mg/dL   GFR, Estimated 49 (L) >60 mL/min    Comment: (NOTE) Calculated using the CKD-EPI Creatinine Equation (2021)    Anion gap 9 5 - 15    Comment: Performed at [facility], [place] [address], [place], [place] [address]  CBC     Status: Abnormal   Collection Time: 04/24/21  1:47 AM  Result Value Ref Range   WBC 7.7 4.0 - 10.5 K/uL   RBC 4.02 (L) 4.22 - 5.81 MIL/uL   Hemoglobin 12.6 (L) 13.0 - 17.0 g/dL   HCT 35.3 (L) 39.0 - 52.0 %   MCV 87.8 80.0 - 100.0 fL   MCH  31.3 26.0 - 34.0 pg   MCHC 35.7 30.0 - 36.0 g/dL   RDW 14.2 11.5 - 15.5 %   Platelets 146 (L) 150 - 400 K/uL   nRBC 0.0 0.0 - 0.2 %    Comment: Performed at [facility], [place] [address] [place], [place] [address]    Assessment/Plan: 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 WBC 7.7 HGB 12.6 (13.5) MCV 87.8 Platelets 146 BUN 18 (23) Cr 1.62 (2.01)  GFR 49  AST 210(262) ALT 514(561) Alkphos 469 (436) TBili 17.2(16.1)  MRCP 04/22/2021 without contrast showed questionable tumor, kidney function has improved today BUN 18 creatinine 1.62 Would suggest repeating MRCP today-patient n.p.o. at this time.  With contrast for better evaluation for potential ERCP/EUS. Pending ceruloplasmin, alpha-1 antitrypsin, anti-smooth muscle and ANA. Ferritin elevated, will get iron with TIBC to evaluate further, but likely acute phase reactant. Stable AST and ALT, alk phos and total bilirubin very slightly increased.  Continue to monitor daily  Vladimir Crofts PA-C 04/24/2021, 8:30 AM  Contact #  [phone]

## 2021-04-25 LAB — CERULOPLASMIN: Ceruloplasmin: 35.6 mg/dL — ABNORMAL HIGH (ref 16.0–31.0)

## 2021-04-25 LAB — ANTI-SMOOTH MUSCLE ANTIBODY, IGG: F-Actin IgG: 9 Units (ref 0–19)

## 2021-04-25 LAB — ANA W/REFLEX IF POSITIVE: Anti Nuclear Antibody (ANA): NEGATIVE

## 2021-04-26 LAB — ALPHA-1-ANTITRYPSIN PHENOTYP: A-1 Antitrypsin, Ser: 220 mg/dL — ABNORMAL HIGH (ref 101–187)

## 2021-06-12 ENCOUNTER — Emergency Department (HOSPITAL_COMMUNITY)
Admission: EM | Admit: 2021-06-12 | Discharge: 2021-06-15 | Disposition: A | Discharge status: Home / Self Care | Payer: BC Managed Care – PPO | Attending: Emergency Medicine | Admitting: Emergency Medicine

## 2021-06-12 ENCOUNTER — Other Ambulatory Visit: Payer: Self-pay

## 2021-06-12 DIAGNOSIS — R770 Abnormality of albumin: Secondary | ICD-10-CM | POA: Diagnosis not present

## 2021-06-12 DIAGNOSIS — E8809 Other disorders of plasma-protein metabolism, not elsewhere classified: Secondary | ICD-10-CM

## 2021-06-12 DIAGNOSIS — R0902 Hypoxemia: Secondary | ICD-10-CM

## 2021-06-12 DIAGNOSIS — R109 Unspecified abdominal pain: Secondary | ICD-10-CM | POA: Insufficient documentation

## 2021-06-12 DIAGNOSIS — J90 Pleural effusion, not elsewhere classified: Secondary | ICD-10-CM | POA: Insufficient documentation

## 2021-06-12 DIAGNOSIS — R0602 Shortness of breath: Secondary | ICD-10-CM | POA: Diagnosis present

## 2021-06-12 DIAGNOSIS — Z20822 Contact with and (suspected) exposure to covid-19: Secondary | ICD-10-CM | POA: Diagnosis not present

## 2021-06-12 MED ORDER — SODIUM CHLORIDE 0.9 % IV BOLUS: 1000.0000 mL | Freq: Once | INTRAVENOUS | Status: DC

## 2021-06-12 MED ORDER — HYDROMORPHONE HCL 1 MG/ML IJ SOLN
1.0000 mg | Freq: Once | INTRAMUSCULAR | Status: AC
  Administered 2021-06-13: 1 mg via INTRAVENOUS
  Filled 2021-06-12: qty 1

## 2021-06-12 NOTE — ED Triage Notes (Signed)
Pt BIB EMS from home. Pt had liver resection surgery on the 14th at [facility]. Pt was released from observation today. Around 8:30pm pt woke up with severe pain at the surgery site to groin - took his oxycodone with no relief  Pt abdomen is distended and rigid  143fent IM given en route

## 2021-06-12 NOTE — ED Provider Notes (Signed)
[facility] EMERGENCY DEPARTMENT Provider Note   CSN: [id number] Arrival date & time: 06/12/21  2248     History  Chief Complaint  Patient presents with   Post-op Problem    Billy Figueroa is a 61 y.o. male.  HPI     [facility] from [facility] today. No discharge summary,  Surgery note from [place] on 06/11/21:  HPI: Billy Figueroa is a 61 y.o. male with a history of HTN, HLD, who was being seen at a follow up appointment after an extended right hepatectomy with biliary tree resection on 05/25/21 with Dr. Manuella Ghazi.   The patient states that since discharge on 12/24, he has been having shortness of breath at home, with his wife noting rapid breathing. He states that he gets winded with everything he does, and endorses general loss of appetite. He has had one episode of vomiting in total, with normal bowel movements and no fevers/chills. He has two post-operative drains in place, and of note he states that one of his drains which was originally dark green has become more mustardy in color.   The patient states he finished his course of post-operative Augmentin. He was still taking his Lovenox injections at home daily.   [place] / SUBJECTIVE   - went to IR yesterday for placement of 2 drains into his abdominal fluid collections.  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  He had a resection done in December and a biliary tree reconstruction in December as well.  Patient had been discharged but then had progressively worsening nausea, vomiting, weakness and anorexia so went back and in addition to his 2 biliary drains had 2 other drains placed and what were thought to be abscesses.  He was just discharged from the  hospital earlier today.  He went home and was doing okay but then acute onset of severe abdominal pain, dyspnea and distention.  He called the surgeon who told take extra pain pill but that it helped go to his local ER to get worked up.  Patient states he has some nausea and vomiting.  He states his drains are draining is normal.  No fevers.  Had a bowel movement this morning.  Has been urinating throughout the day that is been dark but not as dark as its been in the past.   Home Medications Prior to Admission medications   Medication Sig Start Date End Date Taking? Authorizing Provider  acetaminophen (TYLENOL) 325 MG tablet Take 325 mg by mouth every 6 (six) hours as needed for mild pain. 06/12/21 06/22/21 Yes [provider]  amoxicillin-clavulanate (AUGMENTIN) 875-125 MG tablet Take 1 tablet by mouth 2 (two) times daily. 06/12/21  Yes [provider]  desonide (DESOWEN) 0.05 % ointment Apply 1-2 application topically 2 (two) times daily as needed (rash). 03/14/21  Yes [provider]  fluconazole (DIFLUCAN) 200 MG tablet Take by mouth. 06/12/21  Yes [provider]  furosemide (LASIX) 20 MG tablet Take 20 mg by mouth daily.   Yes [provider]  losartan-hydrochlorothiazide (HYZAAR) 50-12.5 MG tablet Take 1 tablet by mouth daily.   Yes [provider]  oxyCODONE (OXY IR/ROXICODONE) 5 MG immediate release  tablet Take 5 mg by mouth every 4 (four) hours as needed for severe pain.   Yes [provider]  PARoxetine (PAXIL) 20 MG tablet Take 20 mg by mouth daily. 04/05/21  Yes [provider]  senna (SENOKOT) 8.6 MG tablet Take 1 tablet by mouth daily.   Yes [provider]      Allergies    Other    Review of Systems   Review of Systems  Physical Exam Updated Vital Signs BP 128/89    Pulse 86    Temp 98.4 F (36.9 C) (Oral)    Resp 20    Ht 6\' 3"  (1.905 m)    Wt 108.9 kg    SpO2 97%    BMI 30.00 kg/m  Physical  Exam Vitals and nursing note reviewed.  Constitutional:      Appearance: He is well-developed.  HENT:     Head: Normocephalic and atraumatic.     Nose: No congestion or rhinorrhea.     Mouth/Throat:     Mouth: Mucous membranes are moist.     Pharynx: Oropharynx is clear.  Eyes:     Pupils: Pupils are equal, round, and reactive to light.  Cardiovascular:     Rate and Rhythm: Normal rate.  Pulmonary:     Effort: Pulmonary effort is normal. No respiratory distress.  Abdominal:     General: There is distension (mild).     Palpations: There is no mass.     Tenderness: There is abdominal tenderness (diffusely). There is no guarding or rebound.     Hernia: No hernia is present.     Comments: Drains: 2 biliary drains c/d/I  Another drain that appears to have biliary fluid but is one of JP drains from the recent hospitalization. Other drain has some slightly bloody serous fluid in it.   Ecchymosiw in LLQ  Scar along right mid line abdomen is c/d/i  Musculoskeletal:        General: Swelling (diffuse with compression stockings on) present. No tenderness or signs of injury. Normal range of motion.     Cervical back: Normal range of motion.  Skin:    General: Skin is warm and dry.  Neurological:     General: No focal deficit present.     Mental Status: He is alert.  Psychiatric:        Mood and Affect: Mood normal.    ED Results / Procedures / Treatments   Labs (all labs ordered are listed, but only abnormal results are displayed) Labs Reviewed  COMPREHENSIVE METABOLIC PANEL - Abnormal; Notable for the following components:      Result Value   Sodium 131 (*)    Glucose, Bld 120 (*)    Calcium 7.9 (*)    Total Protein 6.2 (*)    Albumin 1.7 (*)    AST 54 (*)    ALT 56 (*)    Total Bilirubin 1.7 (*)    All other components within normal limits  CBC WITH DIFFERENTIAL/PLATELET - Abnormal; Notable for the following components:   RBC 3.46 (*)    Hemoglobin 10.7 (*)    HCT 32.7  (*)    RDW 15.7 (*)    Lymphs Abs 0.6 (*)    All other components within normal limits  RESP PANEL BY RT-PCR (FLU A&B, COVID) ARPGX2  CULTURE, BLOOD (ROUTINE X 2)  CULTURE, BLOOD (ROUTINE X 2)  LIPASE, BLOOD  URINALYSIS, ROUTINE W REFLEX MICROSCOPIC  BRAIN NATRIURETIC PEPTIDE  CBC  WITH DIFFERENTIAL/PLATELET    EKG EKG Interpretation  Date/Time:  Monday June 12 2021 22:53:12 EST Ventricular Rate:  91 PR Interval:  125 QRS Duration: 100 QT Interval:  374 QTC Calculation: 461 R Axis:   90 Text Interpretation: Sinus rhythm Borderline right axis deviation Confirmed by Merrily Pew [phone]) on 06/12/2021 11:10:48 PM  Radiology DG Chest Portable 1 View  Result Date: 06/13/2021 CLINICAL DATA:  Chest and upper abdominal pain. EXAM: PORTABLE CHEST 1 VIEW COMPARISON:  None. FINDINGS: The heart and mediastinal contours are within normal limits. Lung volumes are extremely low. There is patchy airspace disease at the lung bases bilaterally with a small left pleural effusion and a moderate right pleural effusion. No pneumothorax is seen. No acute osseous abnormality. IMPRESSION: 1. Low lung volumes with airspace opacities at the lung bases, possible atelectasis or infiltrate. 2. Small left pleural effusion and moderate right pleural effusion. Electronically Signed   By: Brett Fairy M.D.   On: 06/13/2021 00:27    Procedures .Critical Care Performed by: Merrily Pew, MD Authorized by: Merrily Pew, MD   Critical care provider statement:    Critical care time (minutes):  32   Critical care time was exclusive of:  Separately billable procedures and treating other patients and teaching time   Critical care was necessary to treat or prevent imminent or life-threatening deterioration of the following conditions:  Circulatory failure and respiratory failure   Critical care was time spent personally by me on the following activities:  Development of treatment plan with patient or surrogate,  evaluation of patient's response to treatment, examination of patient, obtaining history from patient or surrogate, review of old charts, re-evaluation of patient's condition, pulse oximetry, ordering and performing treatments and interventions and ordering and review of laboratory studies    Medications Ordered in ED Medications  HYDROmorphone (DILAUDID) injection 1 mg (1 mg Intravenous Given 06/13/21 0348)  HYDROmorphone (DILAUDID) injection 1 mg (1 mg Intravenous Given 06/13/21 0213)  albumin human 25 % solution 12.5 g (0 g Intravenous Stopped 06/13/21 0502)  furosemide (LASIX) injection 40 mg (40 mg Intravenous Given 06/13/21 0506)    ED Course/ Medical Decision Making/ A&P Clinical Course as of 06/13/21 0636  Mon Jun 12, 2021  2325 Temp: 97.9 F (36.6 C) On my initial exam is tachypneic in the 40's, HR in the 70's, BP normal  [JM]  2325 Asked nurse to get vitals [JM]  Tue Jun 13, 2021  6314 Patient has been accepted to [facility], Dr. Richardson Landry. Pending bed. [JM]    Clinical Course User Index [JM] Demontez Novack, Corene Cornea, MD                           Medical Decision Making  Very complex surg onc patient.  I think ultimately what happened is that he got hypoalbuminemic and start third spacing fluids and thus got distended and having abdominal pain.  This improves intermittently with Dilaudid.  He also has bilateral pleural effusions which causes tachypnea and ultimate hypoxia.  Patient is excepted to [facility] for transfer after speaking with the transfer line.  He has not any significant respiratory distress but is requiring oxygen so dose of albumin and Lasix given to hopefully pull off some of this fluid.  Care will be transferred to oncoming physician pending transfer.  Final Clinical Impression(s) / ED Diagnoses Final diagnoses:  Pleural effusion  Hypoalbuminemia  Hypoxia    Rx / DC Orders ED Discharge  Orders     None         Mahkai Fangman, Corene Cornea, MD 06/13/21 [phone]

## 2021-06-13 ENCOUNTER — Emergency Department (HOSPITAL_COMMUNITY): Payer: BC Managed Care – PPO

## 2021-06-13 LAB — COMPREHENSIVE METABOLIC PANEL
ALT: 56 U/L — ABNORMAL HIGH (ref 0–44)
AST: 54 U/L — ABNORMAL HIGH (ref 15–41)
Albumin: 1.7 g/dL — ABNORMAL LOW (ref 3.5–5.0)
Alkaline Phosphatase: 109 U/L (ref 38–126)
Anion gap: 9 (ref 5–15)
BUN: 7 mg/dL (ref 6–20)
CO2: 22 mmol/L (ref 22–32)
Calcium: 7.9 mg/dL — ABNORMAL LOW (ref 8.9–10.3)
Chloride: 100 mmol/L (ref 98–111)
Creatinine, Ser: 1 mg/dL (ref 0.61–1.24)
GFR, Estimated: >60 mL/min (ref >60)
Glucose, Bld: 120 mg/dL — ABNORMAL HIGH (ref 70–99)
Potassium: 4.4 mmol/L (ref 3.5–5.1)
Sodium: 131 mmol/L — ABNORMAL LOW (ref 135–145)
Total Bilirubin: 1.7 mg/dL — ABNORMAL HIGH (ref 0.3–1.2)
Total Protein: 6.2 g/dL — ABNORMAL LOW (ref 6.5–8.1)

## 2021-06-13 LAB — CBC WITH DIFFERENTIAL/PLATELET
Abs Immature Granulocytes: 0.02 10*3/uL (ref 0.00–0.07)
Basophils Absolute: 0 10*3/uL (ref 0.0–0.1)
Basophils Relative: 0 %
Eosinophils Absolute: 0 10*3/uL (ref 0.0–0.5)
Eosinophils Relative: 0 %
HCT: 32.7 % — ABNORMAL LOW (ref 39.0–52.0)
Hemoglobin: 10.7 g/dL — ABNORMAL LOW (ref 13.0–17.0)
Immature Granulocytes: 0 %
Lymphocytes Relative: 10 %
Lymphs Abs: 0.6 10*3/uL — ABNORMAL LOW (ref 0.7–4.0)
MCH: 30.9 pg (ref 26.0–34.0)
MCHC: 32.7 g/dL (ref 30.0–36.0)
MCV: 94.5 fL (ref 80.0–100.0)
Monocytes Absolute: 0.6 10*3/uL (ref 0.1–1.0)
Monocytes Relative: 10 %
Neutro Abs: 4.6 10*3/uL (ref 1.7–7.7)
Neutrophils Relative %: 80 %
Platelets: 206 10*3/uL (ref 150–400)
RBC: 3.46 MIL/uL — ABNORMAL LOW (ref 4.22–5.81)
RDW: 15.7 % — ABNORMAL HIGH (ref 11.5–15.5)
WBC: 5.8 10*3/uL (ref 4.0–10.5)
nRBC: 0 % (ref 0.0–0.2)

## 2021-06-13 LAB — URINALYSIS, ROUTINE W REFLEX MICROSCOPIC
Bilirubin Urine: NEGATIVE
Glucose, UA: NEGATIVE mg/dL
Hgb urine dipstick: NEGATIVE
Ketones, ur: NEGATIVE mg/dL
Leukocytes,Ua: NEGATIVE
Nitrite: NEGATIVE
Protein, ur: NEGATIVE mg/dL
Specific Gravity, Urine: 1.014 (ref 1.005–1.030)
pH: 5 (ref 5.0–8.0)

## 2021-06-13 LAB — LIPASE, BLOOD: Lipase: 49 U/L (ref 11–51)

## 2021-06-13 LAB — RESP PANEL BY RT-PCR (FLU A&B, COVID) ARPGX2
Influenza A by PCR: NEGATIVE
Influenza B by PCR: NEGATIVE
SARS Coronavirus 2 by RT PCR: NEGATIVE

## 2021-06-13 LAB — BRAIN NATRIURETIC PEPTIDE: B Natriuretic Peptide: 34.7 pg/mL (ref 0.0–100.0)

## 2021-06-13 MED ORDER — IOHEXOL 300 MG/ML  SOLN
100.0000 mL | Freq: Once | INTRAMUSCULAR | Status: AC | PRN
  Administered 2021-06-13: 100 mL via INTRAVENOUS

## 2021-06-13 MED ORDER — ALBUMIN HUMAN 25 % IV SOLN
12.5000 g | Freq: Once | INTRAVENOUS | Status: AC
  Administered 2021-06-13: 12.5 g via INTRAVENOUS
  Filled 2021-06-13: qty 50

## 2021-06-13 MED ORDER — HYDROMORPHONE HCL 1 MG/ML IJ SOLN
1.0000 mg | INTRAMUSCULAR | Status: DC | PRN
Start: 2021-06-13 — End: 2021-06-15
  Administered 2021-06-13 – 2021-06-14 (×3): 1 mg via INTRAVENOUS
  Filled 2021-06-13 (×4): qty 1

## 2021-06-13 MED ORDER — FUROSEMIDE 10 MG/ML IJ SOLN
40.0000 mg | Freq: Once | INTRAMUSCULAR | Status: AC
Start: 2021-06-13 — End: 2021-06-13
  Administered 2021-06-13: 40 mg via INTRAVENOUS
  Filled 2021-06-13: qty 4

## 2021-06-13 NOTE — ED Provider Notes (Signed)
[facility] EMERGENCY DEPARTMENT Provider Note   CSN: [id number] Arrival date & time: 06/12/21  2248     History  Chief Complaint  Patient presents with   Post-op Problem    Billy Figueroa is a 61 y.o. male.  HPI     Home Medications Prior to Admission medications   Medication Sig Start Date End Date Taking? Authorizing Provider  acetaminophen (TYLENOL) 325 MG tablet Take 325 mg by mouth every 6 (six) hours as needed for mild pain. 06/12/21 06/22/21 Yes [provider]  amoxicillin-clavulanate (AUGMENTIN) 875-125 MG tablet Take 1 tablet by mouth 2 (two) times daily. 06/12/21  Yes [provider]  desonide (DESOWEN) 0.05 % ointment Apply 1-2 application topically 2 (two) times daily as needed (rash). 03/14/21  Yes [provider]  fluconazole (DIFLUCAN) 200 MG tablet Take by mouth. 06/12/21  Yes [provider]  furosemide (LASIX) 20 MG tablet Take 20 mg by mouth daily.   Yes [provider]  losartan-hydrochlorothiazide (HYZAAR) 50-12.5 MG tablet Take 1 tablet by mouth daily.   Yes [provider]  oxyCODONE (OXY IR/ROXICODONE) 5 MG immediate release tablet Take 5 mg by mouth every 4 (four) hours as needed for severe pain.   Yes [provider]  PARoxetine (PAXIL) 20 MG tablet Take 20 mg by mouth daily. 04/05/21  Yes [provider]  senna (SENOKOT) 8.6 MG tablet Take 1 tablet by mouth daily.   Yes [provider]      Allergies    Other    Review of Systems   Review of Systems  Physical Exam Updated Vital Signs BP 116/79    Pulse 79    Temp 98.4 F (36.9 C) (Oral)    Resp 19    Ht 1.905 m (6\' 3" )    Wt 108.9 kg    SpO2 97%    BMI 30.00 kg/m  Physical Exam  ED Results / Procedures / Treatments   Labs (all labs ordered are listed, but only abnormal results are displayed) Labs Reviewed  COMPREHENSIVE METABOLIC PANEL - Abnormal; Notable for the following components:       Result Value   Sodium 131 (*)    Glucose, Bld 120 (*)    Calcium 7.9 (*)    Total Protein 6.2 (*)    Albumin 1.7 (*)    AST 54 (*)    ALT 56 (*)    Total Bilirubin 1.7 (*)    All other components within normal limits  CBC WITH DIFFERENTIAL/PLATELET - Abnormal; Notable for the following components:   RBC 3.46 (*)    Hemoglobin 10.7 (*)    HCT 32.7 (*)    RDW 15.7 (*)    Lymphs Abs 0.6 (*)    All other components within normal limits  RESP PANEL BY RT-PCR (FLU A&B, COVID) ARPGX2  CULTURE, BLOOD (ROUTINE X 2)  CULTURE, BLOOD (ROUTINE X 2)  LIPASE, BLOOD  URINALYSIS, ROUTINE W REFLEX MICROSCOPIC  BRAIN NATRIURETIC PEPTIDE  CBC WITH DIFFERENTIAL/PLATELET    EKG EKG Interpretation  Date/Time:  Monday June 12 2021 22:53:12 EST Ventricular Rate:  91 PR Interval:  125 QRS Duration: 100 QT Interval:  374 QTC Calculation: 461 R Axis:   90 Text Interpretation: Sinus rhythm Borderline right axis deviation Confirmed by Merrily Pew [phone]) on 06/12/2021 11:10:48 PM  Radiology DG Chest Portable 1 View  Result Date: 06/13/2021 CLINICAL DATA:  Chest and upper abdominal pain. EXAM: PORTABLE CHEST 1 VIEW COMPARISON:  None. FINDINGS: The heart and mediastinal contours are within normal limits. Lung volumes are extremely low. There is patchy airspace disease at the lung bases bilaterally with a small left pleural effusion and a moderate right pleural effusion. No pneumothorax is seen. No acute osseous abnormality. IMPRESSION: 1. Low lung volumes with airspace opacities at the lung bases, possible atelectasis or infiltrate. 2. Small left pleural effusion and moderate right pleural effusion. Electronically Signed   By: Brett Fairy M.D.   On: 06/13/2021 00:27    Procedures Procedures    Medications Ordered in ED Medications  HYDROmorphone (DILAUDID) injection 1 mg (1 mg Intravenous Given 06/13/21 0348)  HYDROmorphone (DILAUDID) injection 1 mg (1 mg Intravenous Given 06/13/21 0213)  albumin  human 25 % solution 12.5 g (0 g Intravenous Stopped 06/13/21 0502)  furosemide (LASIX) injection 40 mg (40 mg Intravenous Given 06/13/21 0506)    ED Course/ Medical Decision Making/ A&P Clinical Course as of 06/15/21 1107  Mon Jun 12, 2021  2325 Temp: 97.9 F (36.6 C) On my initial exam is tachypneic in the 40's, HR in the 70's, BP normal  [JM]  2325 Asked nurse to get vitals [JM]  Tue Jun 13, 2021  0545 Patient has been accepted to [facility], Dr. Richardson Landry. Pending bed. [JM]  1450 Received call from Dr. Manuella Ghazi who is excepting physician at [facility].  States that bed will likely be available this evening.  He request that he get a CT of his abdomen and pelvis here to help expedite his care there.  He requested a CT of the abdomen pelvis with IV contrast only. I reviewed his labs and creatinine is 1.0.  CT abdomen pelvis is ordered. [DR]  9798 Reviewed previous work-up and noted new pleural effusions.  Will add CT of chest [DR]  1514 Discussed care with Dr. Maryan Rued who is aware of pending CT and will review [DR]  Thu Jun 15, 2021  9211 Basic metabolic panel Reviewed cbc, inr, lft, bili from today June 15, 2021 Awaiting bmet  [DR]  1106 Bmet reviewed [DR]    Clinical Course User Index [DR] Pattricia Boss, MD [JM] Mesner, Corene Cornea, MD                           Medical Decision Making Recent surgery for hepatocellular carcinoma at [facility] with several post op abscesses, s/p drains, d/c'd yesterday and doing well. Awoke this am with abdominal distension, sob, took pain meds, called [facility] and told to come in. Came here and has bilateral pleural effusions, albumin 1.7 Remainder labs essentially unchanged Oxygen started Albumin given. Patient accepted to [facility] pending transport BPs ok Sats97% on 2 liter [place] done by Dr. Dayna Barker, accepted Richardson Landry  2:10 PM Patient is remained clinically stable here in the ED.  He is still awaiting transport to [place].  Discussed been contacted by the charge  nurse they are awaiting bed assignment.  Final Clinical Impression(s) / ED Diagnoses Final diagnoses:  Pleural effusion  Hypoalbuminemia  Hypoxia    Rx / DC Orders ED Discharge Orders     None         Pattricia Boss, MD 06/13/21 1514

## 2021-06-13 NOTE — Progress Notes (Signed)
Responded to consult for IV. On arrival, noted IV obtained. Consult cleared.

## 2021-06-13 NOTE — ED Notes (Signed)
MD okay okay with pt eating a diet with what his diet is at home.  [place] sandwich provided to pt.

## 2021-06-13 NOTE — ED Notes (Signed)
Pt aware that if he is hurting and is in need of medication that he does have a PRN order he was worried if his pain increases like it did last night he wouldn't have anything so I reassured him

## 2021-06-13 NOTE — ED Notes (Signed)
Called [facility] transfer line to check on bed request, PT is still on there list

## 2021-06-13 NOTE — ED Notes (Signed)
MD Mesner notified only one set of cultures able to be obtained

## 2021-06-13 NOTE — ED Notes (Signed)
Pt desatting as low as 85% with good pleth - started on 2L

## 2021-06-13 NOTE — ED Notes (Signed)
Called [facility] to verify we are just waiting for bed placement for the patient

## 2021-06-13 NOTE — ED Notes (Signed)
Per MD Mesner let oncoming shift know that pt daughter would like to know pt assignment at [facility] when becomes available

## 2021-06-14 NOTE — ED Notes (Signed)
Meal tray ordered for pt. Diet order given yesterday by MD to this RN.  Pt was planned to get bed at [facility] so order wasn't placed due to expected transfer so diet placed today

## 2021-06-14 NOTE — ED Notes (Signed)
Pt placed on hospital bed and given ADL care.  Pt has taken home medications that he has with in

## 2021-06-14 NOTE — ED Notes (Signed)
Pt resting at this time, alert and oriented, drains assessed, minimal drainage in 4 drains, intact. Pt aware we are waiting on transfer at this time.

## 2021-06-15 LAB — CBC
HCT: 32.9 % — ABNORMAL LOW (ref 39.0–52.0)
Hemoglobin: 10.7 g/dL — ABNORMAL LOW (ref 13.0–17.0)
MCH: 30.6 pg (ref 26.0–34.0)
MCHC: 32.5 g/dL (ref 30.0–36.0)
MCV: 94 fL (ref 80.0–100.0)
Platelets: 231 10*3/uL (ref 150–400)
RBC: 3.5 MIL/uL — ABNORMAL LOW (ref 4.22–5.81)
RDW: 15.5 % (ref 11.5–15.5)
WBC: 8.2 10*3/uL (ref 4.0–10.5)
nRBC: 0 % (ref 0.0–0.2)

## 2021-06-15 LAB — HEPATIC FUNCTION PANEL
ALT: 33 U/L (ref 0–44)
AST: 28 U/L (ref 15–41)
Albumin: 1.5 g/dL — ABNORMAL LOW (ref 3.5–5.0)
Alkaline Phosphatase: 71 U/L (ref 38–126)
Bilirubin, Direct: 0.5 mg/dL — ABNORMAL HIGH (ref 0.0–0.2)
Indirect Bilirubin: 1 mg/dL — ABNORMAL HIGH (ref 0.3–0.9)
Total Bilirubin: 1.5 mg/dL — ABNORMAL HIGH (ref 0.3–1.2)
Total Protein: 5.6 g/dL — ABNORMAL LOW (ref 6.5–8.1)

## 2021-06-15 LAB — BASIC METABOLIC PANEL
Anion gap: 5 (ref 5–15)
BUN: 9 mg/dL (ref 6–20)
CO2: 27 mmol/L (ref 22–32)
Calcium: 7.8 mg/dL — ABNORMAL LOW (ref 8.9–10.3)
Chloride: 100 mmol/L (ref 98–111)
Creatinine, Ser: 0.83 mg/dL (ref 0.61–1.24)
GFR, Estimated: >60 mL/min (ref >60)
Glucose, Bld: 94 mg/dL (ref 70–99)
Potassium: 4.1 mmol/L (ref 3.5–5.1)
Sodium: 132 mmol/L — ABNORMAL LOW (ref 135–145)

## 2021-06-15 LAB — PROTIME-INR
INR: 1.3 — ABNORMAL HIGH (ref 0.8–1.2)
Prothrombin Time: 15.8 seconds — ABNORMAL HIGH (ref 11.4–15.2)

## 2021-06-15 MED ORDER — PIPERACILLIN-TAZOBACTAM 3.375 G IVPB
3.3750 g | Freq: Three times a day (TID) | INTRAVENOUS | Status: DC
  Administered 2021-06-15: 3.375 g via INTRAVENOUS
  Filled 2021-06-15: qty 50

## 2021-06-15 MED ORDER — FLUCONAZOLE IN SODIUM CHLORIDE 400-0.9 MG/200ML-% IV SOLN
400.0000 mg | INTRAVENOUS | Status: DC
  Filled 2021-06-15: qty 200

## 2021-06-15 NOTE — ED Notes (Signed)
[facility] Transfer line called to request Dr. Manuella Ghazi give Dr. Tamera Punt a call per Dr. Quentin Angst request.

## 2021-06-15 NOTE — ED Notes (Signed)
Pt wheeled to waiting room and helped into vehicle with wife. Pt verbalized understanding of discharge instructions. Pt instructed to go directly to Dr. Trena Platt office today.

## 2021-06-15 NOTE — Discharge Instructions (Signed)
Dr. Manuella Ghazi will see you in the office this afternoon. Please go to his office.

## 2021-06-15 NOTE — Progress Notes (Signed)
Pharmacy Antibiotic Note  Billy Figueroa is a 61 y.o. male admitted on 06/12/2021 with  intra-abdominal abscess .  Pharmacy has been consulted for Zosyn and Fluconazole dosing. Recent discharge from [facility] on Augmentin and Fluconazole.   Discussed with Dr. Jeanell Sparrow - starting IV antibiotics with Zosyn and Fluconazole for now.   Plan: Zosyn 3.375g IV q8h (4 hour infusion). Fluconazole 400mg  IV every 24 hours Follow-up renal function, clinical status, and culture results  Height: 6\' 3"  (190.5 cm) Weight: 108.9 kg (240 lb) IBW/kg (Calculated) : 84.5  Temp (24hrs), Avg:97.8 F (36.6 C), Min:97.8 F (36.6 C), Max:97.8 F (36.6 C)  Recent Labs  Lab 06/13/21 0043 06/13/21 0138  WBC  --  5.8  CREATININE 1.00  --     Estimated Creatinine Clearance: 107.4 mL/min (by C-G formula based on SCr of 1 mg/dL).    Allergies  Allergen Reactions   Other Anaphylaxis and Other (See Comments)    Apples raw throat swelling    Antimicrobials this admission: Zosyn 1/5 >> Fluconazole 1/5 >>  Dose adjustments this admission:   Microbiology results: pending  Thank you for allowing pharmacy to be a part of this patients care.  Brain Hilts 06/15/2021 8:50 AM

## 2021-06-15 NOTE — ED Notes (Signed)
[facility] Transfer line called to get an update on patients status, stated patient is still on the list and they have no available beds and don't know when one will become available.

## 2021-06-15 NOTE — ED Provider Notes (Signed)
Patient holding in the ED for transfer to [facility] Attempted to contact Dr. Manuella Ghazi, patient's surgeon at [place] reviewed Patient with multiple fluid collections with 2 drains in place neither which appear to be draining these new fluid collections Discussed care with pharmacy and started Zosyn and Diflucan [facility] transfer line states they continue to have no beds available Called Dr. Manuella Ghazi directly at his office number stating care everywhere Discussed with Ivin Booty in Dr. Trena Platt office and she will go to clinic to give him message and call back number. Reviewing care everywhere.  Patient previously fairly healthy. Mid December he started having indigestion and noted dark urine an dhad elevated liver enzymes.  He came to [place] with jaundice and ct was unremarkable. 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 Family requested d/c to go to [facility]. At [facility] he underwnt ERCP with stenting On 06/24/20 he wnt to OR for right hepatectomy.  He was d/c'd 12/24/ Note from 12/30 states he had purulent output from drains an dwould admit for further work up.  Reviewed d/c notes and on new review, drains placed.  Discussed care with Dr. Manuella Ghazi Dr. Trena Platt cell phone is [phone] Discussed with patient.  He is awake and alert and oriented.  His heart rate is 85 with blood pressure 122/77 temperature 97.8 and oxygen saturations 95% on my exam on room air Will recheck CBC, c-Met, and INR. If these are normal, patient appears stable for discharge. Dr. Manuella Ghazi will see him in close follow-up in his office this afternoon Awaiting lab results to determine disposition Labs reviewed and no siginificant change.  Patient appears stable for d/c.  Discussed with patient and his wife will take him to see Dr.Shah this afternoon.       Pattricia Boss, MD 06/15/21 [phone]

## 2021-06-18 LAB — CULTURE, BLOOD (ROUTINE X 2): Culture: NO GROWTH

## 2021-06-19 LAB — CULTURE, BLOOD (ROUTINE X 2)
Culture: NO GROWTH
Special Requests: ADEQUATE

## 2022-05-21 IMAGING — MR MR HEAD W/O CM
13 of 18 series · 34 of 48 positions shown · non-contrast
Comparison: Prior head CT from earlier the same day.

CLINICAL DATA: Initial evaluation for neuro deficit, stroke
suspected. Question abnormality on prior head CT.

EXAM:
MRI HEAD WITHOUT CONTRAST
TECHNIQUE: Multiplanar, multiecho pulse sequences of the brain and surrounding
structures were obtained without intravenous contrast.

[Series 5: DWI · axial · 3.0mm · 0.88mm/px · z∈[-130,+28]mm · 6 of 110 slices shown (1 of 4)]
[im 1/110]
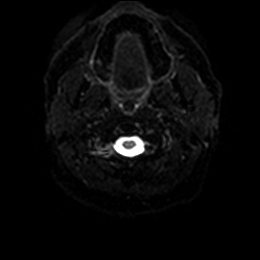
[im 22/110]
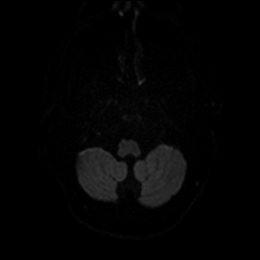
[im 44/110]
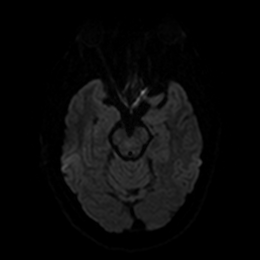
[im 66/110]
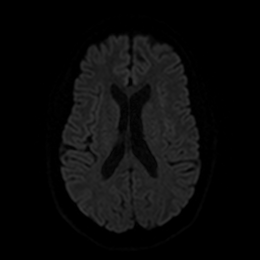
[im 88/110]
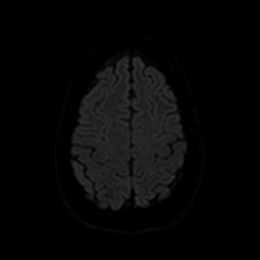
[im 110/110]
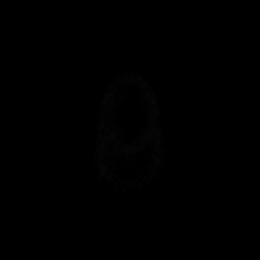

[Series 6: DWI · axial · 3.0mm · 0.88mm/px · z∈[-130,+28]mm · 2 of 55 slices shown (2 of 4)]
[im 1/55]
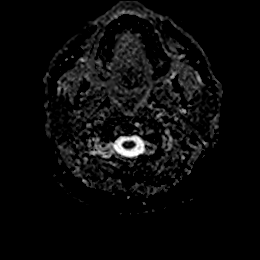
[im 55/55]
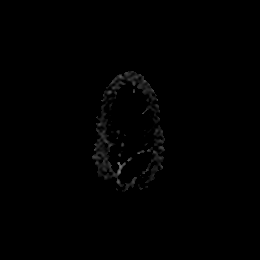

[Series 7: DWI · coronal · 4.0mm · 0.88mm/px · 4 of 76 slices shown (3 of 4)]
[im 1/76]
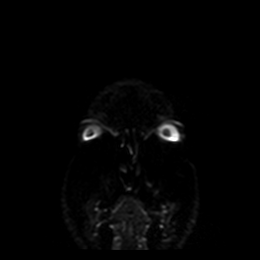
[im 26/76]
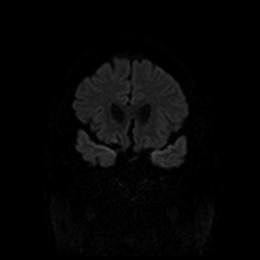
[im 51/76]
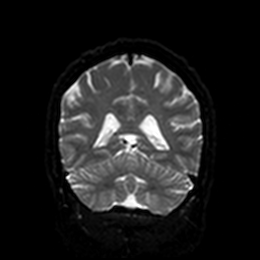
[im 76/76]
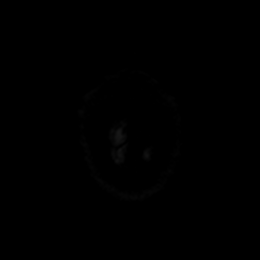

[Series 8: DWI · coronal · 4.0mm · 0.88mm/px · 1 of 38 slices shown (4 of 4)]
[im 1/38]
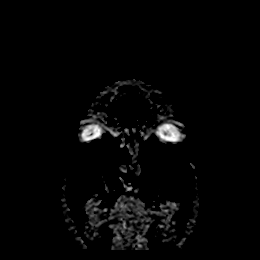

[Series 9: T1 · sagittal · 5.0mm · 0.75mm/px · 1 of 25 slices shown]
[im 1/25]
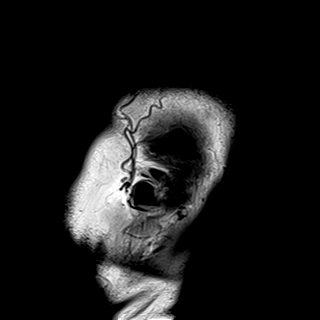

[Series 10: T2 · axial · 5.0mm · 0.72mm/px · 1 of 1 slices shown (1 of 2)]
[im 1/1]
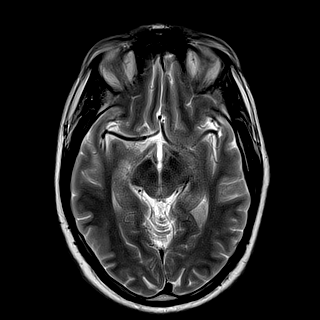

[Series 11: FLAIR · axial · 5.0mm · 0.45mm/px · 1 of 1 slices shown (1 of 2)]
[im 1/1]
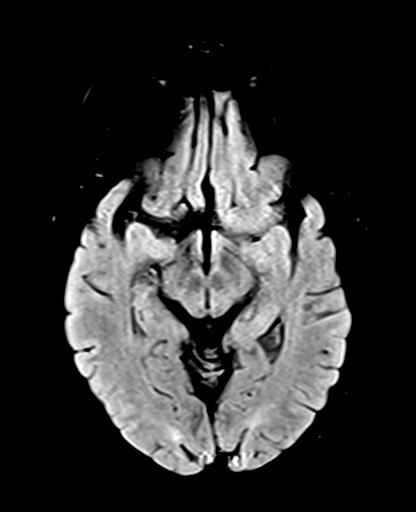

[Series 12: mag_images · axial · 3.0mm · 0.90mm/px · z∈[-144,+28]mm · 3 of 60 slices shown]
[im 1/60]
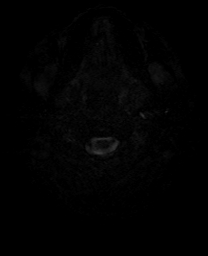
[im 30/60]
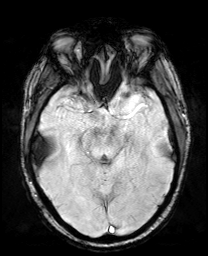
[im 60/60]
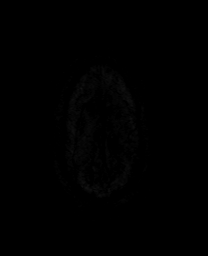

[Series 13: pha_images · axial · 3.0mm · 0.90mm/px · z∈[-144,+28]mm · 4 of 60 slices shown]
[im 1/60]
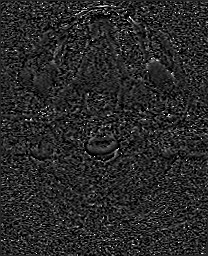
[im 20/60]
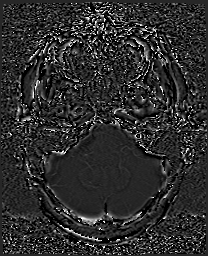
[im 40/60]
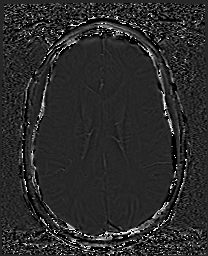
[im 60/60]
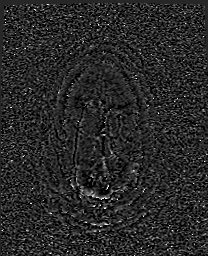

[Series 14: swi_images · axial · 3.0mm · 0.90mm/px · z∈[-144,+28]mm · 4 of 60 slices shown]
[im 1/60]
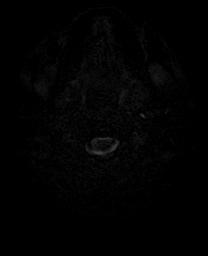
[im 20/60]
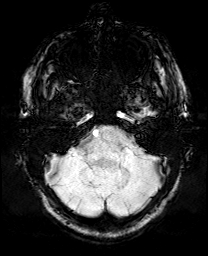
[im 40/60]
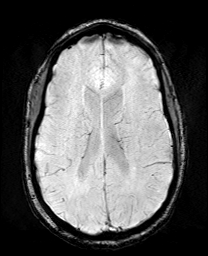
[im 60/60]
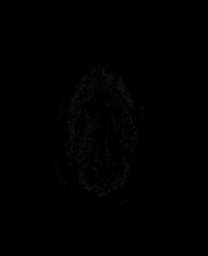

[Series 15: mip_images(sw) · axial · 24.0mm · 0.90mm/px · z∈[-134,+18]mm · 3 of 53 slices shown]
[im 1/53]
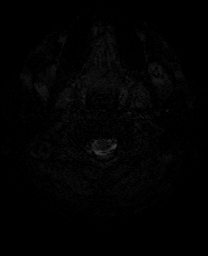
[im 27/53]
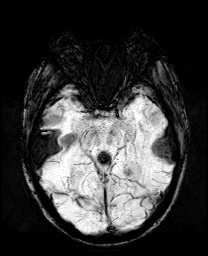
[im 53/53]
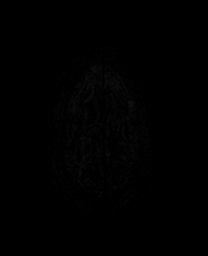

[Series 17: T2 · coronal · 5.0mm · 0.34mm/px · 2 of 33 slices shown (2 of 2)]
[im 1/33]
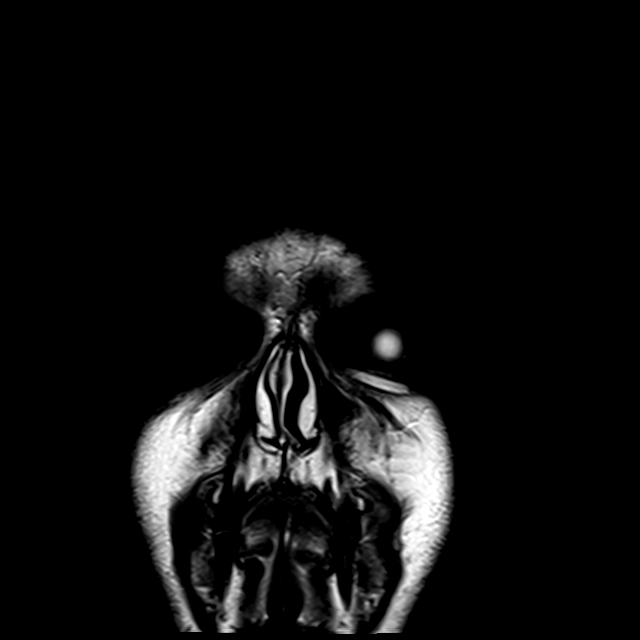
[im 33/33]
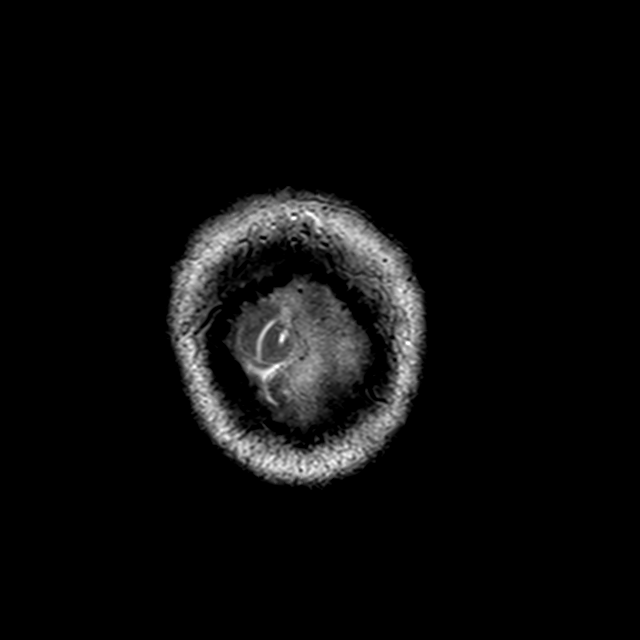

[Series 18: FLAIR · axial · 5.0mm · 0.90mm/px · z∈[-136,+28]mm · 2 of 29 slices shown (2 of 2)]
[im 1/29]
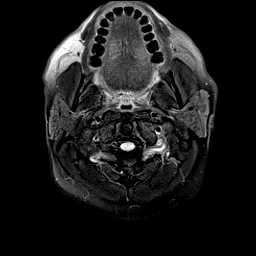
[im 29/29]
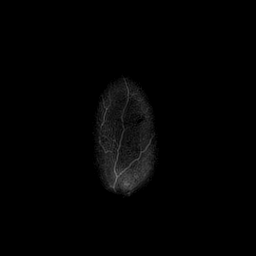

[34 of 48 positions shown; findings below may reference images not displayed]

FINDINGS: Brain: Cerebral volume within normal limits. No significant cerebral
white matter disease for age.

No abnormal foci of restricted diffusion to suggest acute or
subacute ischemia. Gray-white matter differentiation maintained. No
encephalomalacia to suggest chronic cortical infarction. No evidence
for acute or chronic intracranial hemorrhage.

No mass lesion, mass effect or midline shift. No hydrocephalus or
extra-axial fluid collection. Pituitary gland suprasellar region
normal. Midline structures intact.

Vascular: Major intracranial vascular flow voids are maintained at
the skull base.

Skull and upper cervical spine: Craniocervical junction within
normal limits. Bone marrow signal intensity within normal limits. No
scalp soft tissue abnormality.

Sinuses/Orbits: Globes and orbital soft tissues demonstrate no acute
finding. Mild scattered mucosal thickening noted within the
ethmoidal air cells. Paranasal sinuses are otherwise largely clear.
No significant mastoid effusion.

Other: None.
IMPRESSION: Normal brain MRI for age. No acute intracranial abnormality
identified. Previously questioned abnormality involving the
bifrontal region was artifactual on prior CT.

## 2022-07-12 IMAGING — CT CT CHEST-ABD-PELV W/ CM
2 of 5 series · 12 of 36 positions shown, 14 images · IV contrast (Omni 300)
Comparison: 04/22/2021 MRI and CT

CLINICAL DATA: Liver resection 05/24/2021, severe pain at surgery
site radiating to groin, abdominal distension

EXAM:
CT CHEST, ABDOMEN, AND PELVIS WITH CONTRAST
TECHNIQUE: Multidetector CT imaging of the chest, abdomen and pelvis was
performed following the standard protocol during bolus
administration of intravenous contrast.
CONTRAST:  100mL OMNIPAQUE IOHEXOL 300 MG/ML  SOLN

[Series 4: cap with 5mm st · axial · 0.96mm/px · z∈[+805,+1410]mm · 9 of 147 slices shown, 11 images]
[im 13/147  mediastinal]
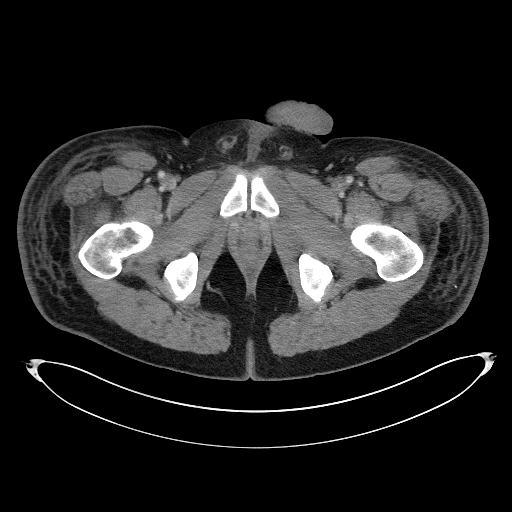
[im 13/147  bone]
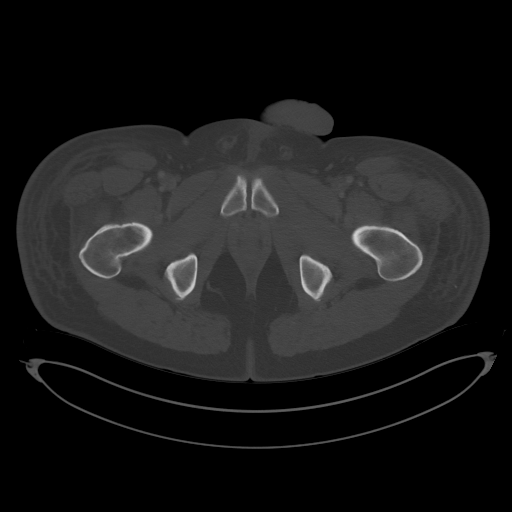
[im 25/147  mediastinal]
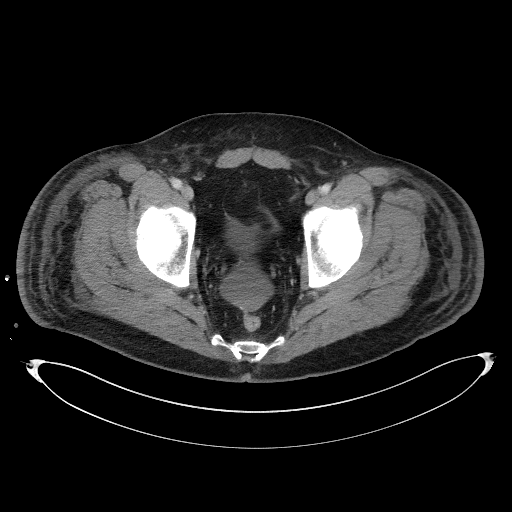
[im 49/147  mediastinal]
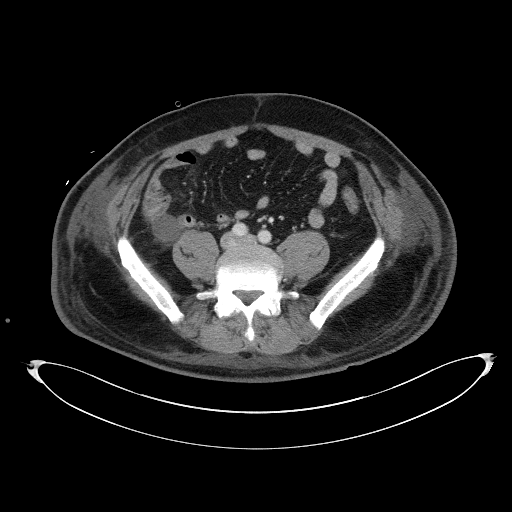
[im 61/147  mediastinal]
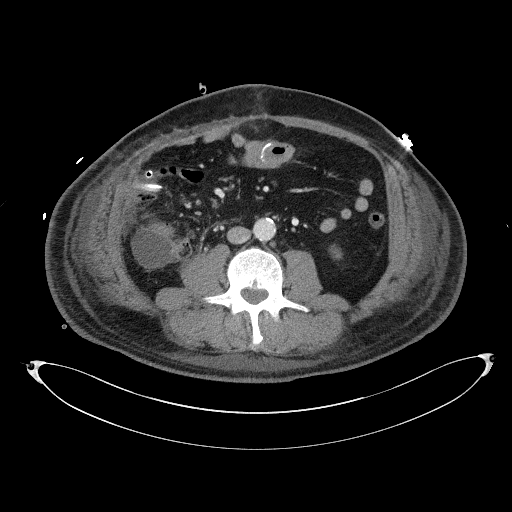
[im 74/147  mediastinal]
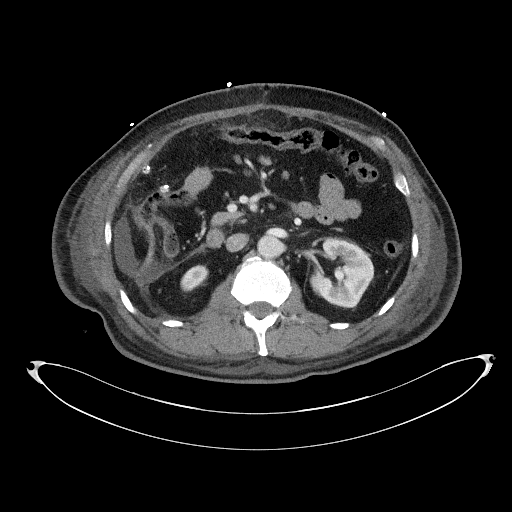
[im 86/147  mediastinal]
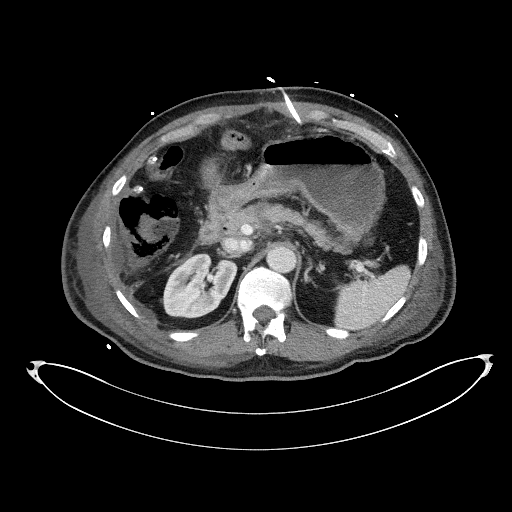
[im 98/147  mediastinal]
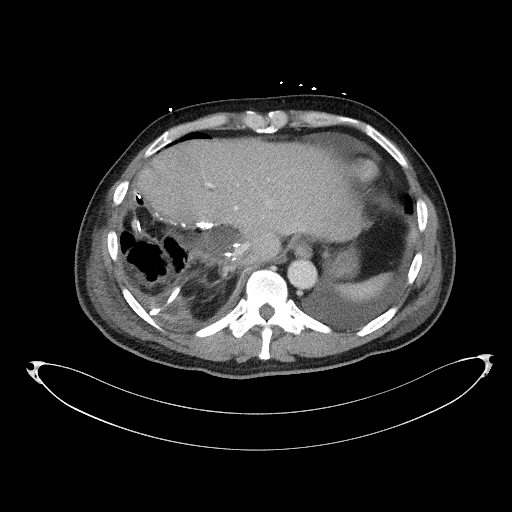
[im 122/147  mediastinal]
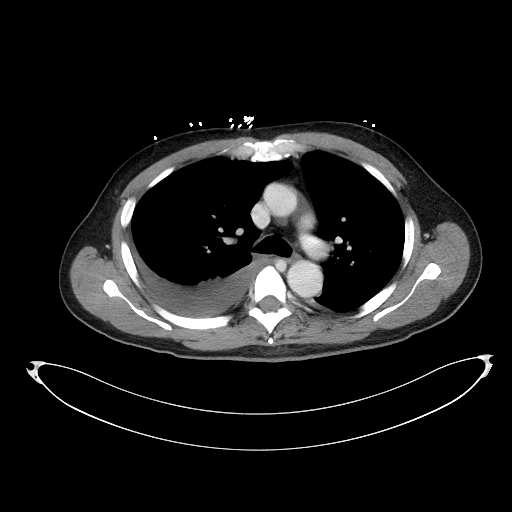
[im 134/147  mediastinal]
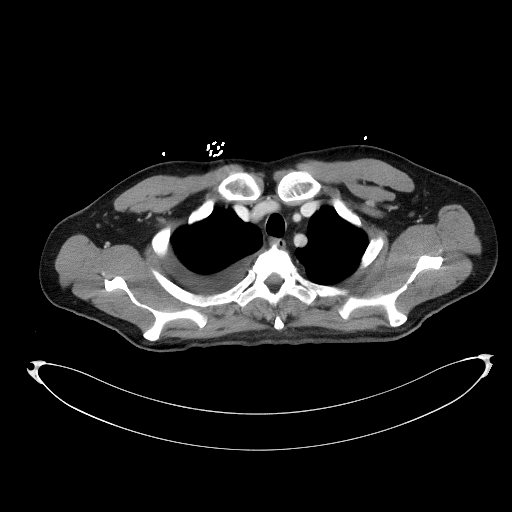
[im 134/147  bone]
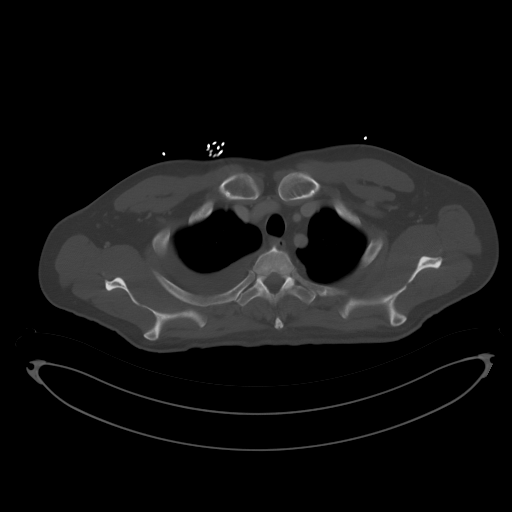

[Series 7: cap with 3mm st cor · coronal · 0.78mm/px · 3 of 150 slices shown]
[im 30/150  mediastinal]
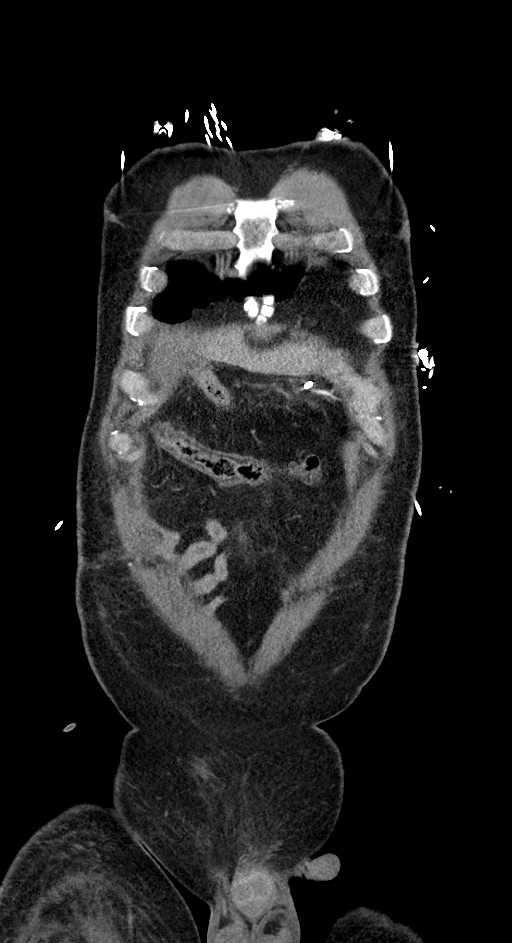
[im 60/150  mediastinal]
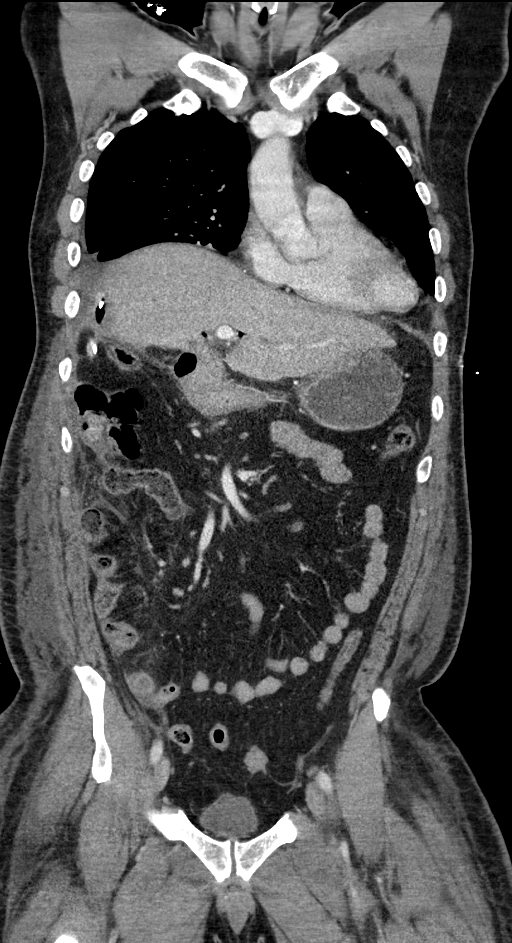
[im 90/150  mediastinal]
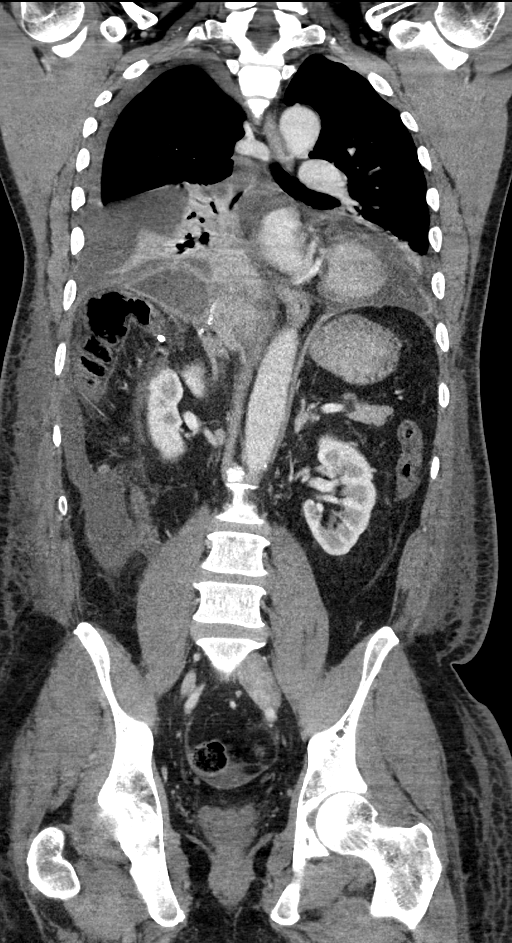

[12 of 36 positions shown; findings below may reference images not displayed]

FINDINGS: CT CHEST FINDINGS

Cardiovascular: Trace pericardial effusion. The heart is not
enlarged. No evidence of thoracic aortic aneurysm or dissection.

Mediastinum/Nodes: No enlarged mediastinal, hilar, or axillary lymph
nodes. Thyroid gland, trachea, and esophagus demonstrate no
significant findings.

Lungs/Pleura: There are small bilateral pleural effusions, right
greater than left. Marked compressive atelectasis within the lower
lobes. No airspace disease or pneumothorax. Central airways are
patent. Elevation of the right hemidiaphragm.

Musculoskeletal: There are no acute or destructive bony lesions.
Reconstructed images demonstrate no additional findings.

CT ABDOMEN PELVIS FINDINGS

Hepatobiliary: Postsurgical changes are seen from a partial right
hepatectomy surgical drains enter the abdomen in the right lower
quadrant, and extend cephalad to reside along the inferior margin of
the left lobe of the liver and along the lateral margin of the
hepatectomy surgical bed.

There are 2 percutaneous pigtail drainage catheter is identified as
well. One enters the midline upper abdomen and is coiled along the
left lobe liver. The second enters in the right posterolateral
intercostal region, and is coiled in the sub hepatic space at the
surgical bed.

The multilocular fluid collection is seen along the surgical bed at
the hepatectomy site, measuring approximately 10.3 x 4.7 by 7.6 cm,
reference image 45/4 and 82/7. There is peripheral rim enhancement
with internal gas, compatible with abscess. This fluid collection is
traversed by the right lateral surgical drain. The fluid collection
extends inferiorly along the duodenum, with a second inferior
loculation measuring approximately 9.9 by 2.7 by 2.8 cm. This
collection is traversed by the pigtail drainage catheter from the
right intercostal approach. Peripheral enhancement and internal gas
consistent with abscess.

Pneumobilia consistent with choledocho jejunostomy. The biliary gas
appears contiguous with the multilocular fluid collections described
above, and anastomotic breakdown of the choledocho jejunostomy
cannot be excluded.

The remaining portions of the liver unremarkable. The gallbladder is
surgically absent.

Pancreas: Unremarkable. No pancreatic ductal dilatation or
surrounding inflammatory changes.

Spleen: Normal in size without focal abnormality.

Adrenals/Urinary Tract: Adrenal glands are unremarkable. Kidneys are
normal, without renal calculi, focal lesion, or hydronephrosis.
Bladder is unremarkable.

Stomach/Bowel: No evidence of bowel obstruction or ileus.
Postsurgical changes related to hepatic resection and likely
choledocho jejunostomy as described above. Please correlate with
surgical history. Concern for anastomotic breakdown at the
choledocho jejunostomy based on findings discussed above.

Vascular/Lymphatic: SMV, splenic vein, and portal vein are patent.
There is some mild extrinsic narrowing of the portal vein due to
inflammatory changes at the porta hepatis, likely postsurgical in
nature. Minimal atherosclerosis. No pathologic adenopathy.

Reproductive: Prostate is unremarkable.

Other: In addition to the complex rim enhancing fluid collections
described above, there is multilocular simple appearing fluid
extending in the right paracolic gutter, largest collection
measuring 11.1 x 5.5 x 3.7 cm. There is trace free fluid within the
lower pelvis. Loculated gas within the complex fluid collections
described above. There is trace amount of free gas along the
surgical drain coiled along the undersurface of the left lobe liver.
No other evidence of pneumoperitoneum. No abdominal wall hernia.

Musculoskeletal: Bilateral subcutaneous flank edema, right greater
than left. No acute or destructive bony lesions. Reconstructed
images demonstrate no additional findings.
IMPRESSION: 1. Postsurgical changes from partial right lobe hepatectomy,
cholecystectomy, and choledocho jejunostomy as described above.
2. Multiple complex rim enhancing fluid collections with internal
gas, centered along the surgical bed and choledocho jejunostomy
site. Communication between the pneumobilia and the gas/fluid
collections is concerning for anastomotic breakdown of the
choledocho jejunostomy site.
3. Multiple indwelling percutaneous pigtail drains and surgical
drains as above. The right-sided drainage catheters traverse the
complex sub hepatic fluid collection.
4. Multilocular simple appearing fluid along the right paracolic
gutter, without internal gas or rim enhancement to suggest abscess.
Trace free fluid in the lower pelvis.
5. Bilateral pleural effusions, with compressive lower lobe
atelectasis.
6.  Choose 1

## 2022-11-10 DEATH — deceased
